# Patient Record
Sex: Male | Born: 1935 | Race: White | Hispanic: No | Marital: Married | Smoking: Current every day smoker
Health system: Southern US, Community
[De-identification: ages and names within clinical notes are randomized; demographics above are authoritative.]

## PROBLEM LIST (undated history)

## (undated) DIAGNOSIS — N200 Calculus of kidney: Secondary | ICD-10-CM

## (undated) DIAGNOSIS — E079 Disorder of thyroid, unspecified: Secondary | ICD-10-CM

## (undated) DIAGNOSIS — I10 Essential (primary) hypertension: Secondary | ICD-10-CM

## (undated) DIAGNOSIS — C189 Malignant neoplasm of colon, unspecified: Secondary | ICD-10-CM

## (undated) DIAGNOSIS — G629 Polyneuropathy, unspecified: Secondary | ICD-10-CM

## (undated) DIAGNOSIS — D649 Anemia, unspecified: Secondary | ICD-10-CM

## (undated) DIAGNOSIS — E785 Hyperlipidemia, unspecified: Secondary | ICD-10-CM

## (undated) DIAGNOSIS — F1010 Alcohol abuse, uncomplicated: Secondary | ICD-10-CM

## (undated) HISTORY — DX: Alcohol abuse, uncomplicated: F10.10

## (undated) HISTORY — DX: Malignant neoplasm of colon, unspecified: C18.9

## (undated) HISTORY — DX: Essential (primary) hypertension: I10

## (undated) HISTORY — DX: Polyneuropathy, unspecified: G62.9

## (undated) HISTORY — DX: Calculus of kidney: N20.0

## (undated) HISTORY — PX: CATARACT EXTRACTION W/ INTRAOCULAR LENS  IMPLANT, BILATERAL: SHX1307

## (undated) HISTORY — PX: COLONOSCOPY: SHX174

## (undated) HISTORY — DX: Anemia, unspecified: D64.9

## (undated) HISTORY — DX: Hyperlipidemia, unspecified: E78.5

---

## 1997-12-28 ENCOUNTER — Emergency Department (HOSPITAL_COMMUNITY): Admission: EM | Admit: 1997-12-28 | Discharge: 1997-12-28 | Payer: Self-pay | Admitting: Emergency Medicine

## 2001-06-09 ENCOUNTER — Encounter: Payer: Self-pay | Admitting: Emergency Medicine

## 2001-06-09 ENCOUNTER — Encounter (INDEPENDENT_AMBULATORY_CARE_PROVIDER_SITE_OTHER): Payer: Self-pay | Admitting: *Deleted

## 2001-06-10 ENCOUNTER — Encounter: Payer: Self-pay | Admitting: General Surgery

## 2001-06-10 ENCOUNTER — Inpatient Hospital Stay (HOSPITAL_COMMUNITY): Admission: EM | Admit: 2001-06-10 | Discharge: 2001-06-22 | Payer: Self-pay | Admitting: Emergency Medicine

## 2001-06-11 ENCOUNTER — Encounter: Payer: Self-pay | Admitting: General Surgery

## 2001-06-11 HISTORY — PX: COLOSTOMY: SHX63

## 2001-06-14 ENCOUNTER — Encounter: Payer: Self-pay | Admitting: General Surgery

## 2001-06-20 ENCOUNTER — Encounter: Payer: Self-pay | Admitting: General Surgery

## 2001-07-10 ENCOUNTER — Encounter (HOSPITAL_COMMUNITY): Payer: Self-pay | Admitting: Oncology

## 2001-07-10 ENCOUNTER — Ambulatory Visit (HOSPITAL_COMMUNITY): Admission: RE | Admit: 2001-07-10 | Discharge: 2001-07-10 | Payer: Self-pay | Admitting: Oncology

## 2002-01-10 ENCOUNTER — Encounter (HOSPITAL_COMMUNITY): Payer: Self-pay | Admitting: Oncology

## 2002-01-10 ENCOUNTER — Ambulatory Visit (HOSPITAL_COMMUNITY): Admission: RE | Admit: 2002-01-10 | Discharge: 2002-01-10 | Payer: Self-pay | Admitting: Oncology

## 2002-03-07 ENCOUNTER — Ambulatory Visit (HOSPITAL_COMMUNITY): Admission: RE | Admit: 2002-03-07 | Discharge: 2002-03-07 | Payer: Self-pay | Admitting: Gastroenterology

## 2002-03-27 ENCOUNTER — Ambulatory Visit (HOSPITAL_COMMUNITY): Admission: RE | Admit: 2002-03-27 | Discharge: 2002-03-27 | Payer: Self-pay | Admitting: General Surgery

## 2002-03-27 ENCOUNTER — Encounter: Payer: Self-pay | Admitting: General Surgery

## 2002-05-02 ENCOUNTER — Encounter (HOSPITAL_COMMUNITY): Payer: Self-pay | Admitting: Oncology

## 2002-05-02 ENCOUNTER — Ambulatory Visit (HOSPITAL_COMMUNITY): Admission: RE | Admit: 2002-05-02 | Discharge: 2002-05-02 | Payer: Self-pay | Admitting: Oncology

## 2002-05-13 ENCOUNTER — Inpatient Hospital Stay (HOSPITAL_COMMUNITY): Admission: RE | Admit: 2002-05-13 | Discharge: 2002-05-18 | Payer: Self-pay | Admitting: General Surgery

## 2002-05-13 ENCOUNTER — Encounter (INDEPENDENT_AMBULATORY_CARE_PROVIDER_SITE_OTHER): Payer: Self-pay | Admitting: Specialist

## 2002-05-13 HISTORY — PX: OTHER SURGICAL HISTORY: SHX169

## 2003-04-08 ENCOUNTER — Encounter (HOSPITAL_COMMUNITY): Payer: Self-pay | Admitting: Oncology

## 2003-04-08 ENCOUNTER — Ambulatory Visit (HOSPITAL_COMMUNITY): Admission: RE | Admit: 2003-04-08 | Discharge: 2003-04-08 | Payer: Self-pay | Admitting: Oncology

## 2004-03-04 ENCOUNTER — Ambulatory Visit (HOSPITAL_COMMUNITY): Admission: RE | Admit: 2004-03-04 | Discharge: 2004-03-04 | Payer: Self-pay | Admitting: Oncology

## 2004-08-20 ENCOUNTER — Ambulatory Visit: Payer: Self-pay | Admitting: Oncology

## 2005-02-24 ENCOUNTER — Ambulatory Visit: Payer: Self-pay | Admitting: Oncology

## 2005-02-24 ENCOUNTER — Ambulatory Visit (HOSPITAL_COMMUNITY): Admission: RE | Admit: 2005-02-24 | Discharge: 2005-02-24 | Payer: Self-pay | Admitting: Oncology

## 2005-05-23 ENCOUNTER — Ambulatory Visit: Payer: Self-pay | Admitting: Oncology

## 2005-11-17 ENCOUNTER — Ambulatory Visit: Payer: Self-pay | Admitting: Oncology

## 2005-11-22 LAB — COMPREHENSIVE METABOLIC PANEL
Albumin: 4.5 g/dL (ref 3.5–5.2)
Alkaline Phosphatase: 68 U/L (ref 39–117)
BUN: 17 mg/dL (ref 6–23)
CO2: 26 mEq/L (ref 19–32)
Calcium: 9.6 mg/dL (ref 8.4–10.5)
Chloride: 104 mEq/L (ref 96–112)
Glucose, Bld: 103 mg/dL — ABNORMAL HIGH (ref 70–99)
Potassium: 4.5 mEq/L (ref 3.5–5.3)
Sodium: 143 mEq/L (ref 135–145)
Total Protein: 7.5 g/dL (ref 6.0–8.3)

## 2005-11-22 LAB — CBC WITH DIFFERENTIAL/PLATELET
Basophils Absolute: 0 10*3/uL (ref 0.0–0.1)
EOS%: 2.9 % (ref 0.0–7.0)
Eosinophils Absolute: 0.2 10*3/uL (ref 0.0–0.5)
HCT: 41.1 % (ref 38.7–49.9)
HGB: 14.4 g/dL (ref 13.0–17.1)
MCH: 34.1 pg — ABNORMAL HIGH (ref 28.0–33.4)
MONO#: 0.8 10*3/uL (ref 0.1–0.9)
NEUT#: 3.9 10*3/uL (ref 1.5–6.5)
NEUT%: 59.3 % (ref 40.0–75.0)
RDW: 13.5 % (ref 11.2–14.6)
WBC: 6.6 10*3/uL (ref 4.0–10.0)
lymph#: 1.7 10*3/uL (ref 0.9–3.3)

## 2005-11-22 LAB — LACTATE DEHYDROGENASE: LDH: 142 U/L (ref 94–250)

## 2006-04-18 ENCOUNTER — Ambulatory Visit (HOSPITAL_COMMUNITY): Admission: RE | Admit: 2006-04-18 | Discharge: 2006-04-18 | Payer: Self-pay | Admitting: Oncology

## 2006-05-19 ENCOUNTER — Ambulatory Visit: Payer: Self-pay | Admitting: Oncology

## 2006-05-23 LAB — CBC WITH DIFFERENTIAL/PLATELET
BASO%: 0.3 % (ref 0.0–2.0)
Basophils Absolute: 0 10*3/uL (ref 0.0–0.1)
Eosinophils Absolute: 0.1 10*3/uL (ref 0.0–0.5)
HGB: 14 g/dL (ref 13.0–17.1)
LYMPH%: 20 % (ref 14.0–48.0)
MCH: 34.1 pg — ABNORMAL HIGH (ref 28.0–33.4)
MCHC: 35.4 g/dL (ref 32.0–35.9)
MONO#: 0.7 10*3/uL (ref 0.1–0.9)
lymph#: 1.4 10*3/uL (ref 0.9–3.3)

## 2006-05-23 LAB — COMPREHENSIVE METABOLIC PANEL
ALT: 14 U/L (ref 0–53)
BUN: 11 mg/dL (ref 6–23)
CO2: 25 mEq/L (ref 19–32)
Calcium: 8.6 mg/dL (ref 8.4–10.5)
Chloride: 105 mEq/L (ref 96–112)
Creatinine, Ser: 0.93 mg/dL (ref 0.40–1.50)
Total Bilirubin: 0.8 mg/dL (ref 0.3–1.2)

## 2006-05-23 LAB — CEA: CEA: 2.1 ng/mL (ref 0.0–5.0)

## 2006-05-23 LAB — LACTATE DEHYDROGENASE: LDH: 139 U/L (ref 94–250)

## 2006-05-29 ENCOUNTER — Ambulatory Visit (HOSPITAL_COMMUNITY): Admission: RE | Admit: 2006-05-29 | Discharge: 2006-05-29 | Payer: Self-pay | Admitting: Oncology

## 2006-11-28 ENCOUNTER — Ambulatory Visit: Payer: Self-pay | Admitting: Oncology

## 2006-11-30 LAB — CBC WITH DIFFERENTIAL/PLATELET
BASO%: 0.4 % (ref 0.0–2.0)
HCT: 41.4 % (ref 38.7–49.9)
MCHC: 35.8 g/dL (ref 32.0–35.9)
MONO#: 0.6 10*3/uL (ref 0.1–0.9)
NEUT%: 60.1 % (ref 40.0–75.0)
RDW: 13.5 % (ref 11.2–14.6)
WBC: 7.1 10*3/uL (ref 4.0–10.0)
lymph#: 2.1 10*3/uL (ref 0.9–3.3)

## 2006-11-30 LAB — COMPREHENSIVE METABOLIC PANEL
ALT: 15 U/L (ref 0–53)
Albumin: 4.4 g/dL (ref 3.5–5.2)
CO2: 23 mEq/L (ref 19–32)
Calcium: 9.2 mg/dL (ref 8.4–10.5)
Chloride: 108 mEq/L (ref 96–112)
Creatinine, Ser: 0.92 mg/dL (ref 0.40–1.50)
Potassium: 4.1 mEq/L (ref 3.5–5.3)
Sodium: 142 mEq/L (ref 135–145)
Total Protein: 7.5 g/dL (ref 6.0–8.3)

## 2006-11-30 LAB — LACTATE DEHYDROGENASE: LDH: 153 U/L (ref 94–250)

## 2007-05-29 ENCOUNTER — Ambulatory Visit: Payer: Self-pay | Admitting: Oncology

## 2007-05-31 ENCOUNTER — Ambulatory Visit (HOSPITAL_COMMUNITY): Admission: RE | Admit: 2007-05-31 | Discharge: 2007-05-31 | Payer: Self-pay | Admitting: Oncology

## 2007-05-31 LAB — CBC WITH DIFFERENTIAL/PLATELET
BASO%: 0.7 % (ref 0.0–2.0)
Eosinophils Absolute: 0.2 10*3/uL (ref 0.0–0.5)
MCHC: 35.6 g/dL (ref 32.0–35.9)
MONO#: 0.6 10*3/uL (ref 0.1–0.9)
NEUT#: 3.8 10*3/uL (ref 1.5–6.5)
RBC: 4.23 10*6/uL (ref 4.20–5.71)
RDW: 13.4 % (ref 11.2–14.6)
WBC: 7 10*3/uL (ref 4.0–10.0)
lymph#: 2.3 10*3/uL (ref 0.9–3.3)

## 2007-05-31 IMAGING — CT CT PELVIS W/ CM
1 of 3 series · 13 of 32 positions shown, 18 images · IV contrast (omnipaque)
Comparison: 01/10/02.

CLINICAL DATA: 69 year old with history of colon cancer; right lower quadrant abdominal pain.
 ABDOMEN CT WITH CONTRAST:
TECHNIQUE: Multidetector CT imaging of the abdomen was performed following the standard protocol during bolus administration of intravenous contrast.
 Contrast:  125 cc Omnipaque 300 IV.  Dilute oral contrast was used.
TECHNIQUE: Multidetector CT imaging of the pelvis was performed following the standard protocol during bolus administration of intravenous contrast.

[Series 2: abd_pel 5.0 b40f st · axial · 0.70mm/px · z∈[-239,+146]mm · 13 of 87 slices shown, 18 images]
[im 5/87  soft-tissue]
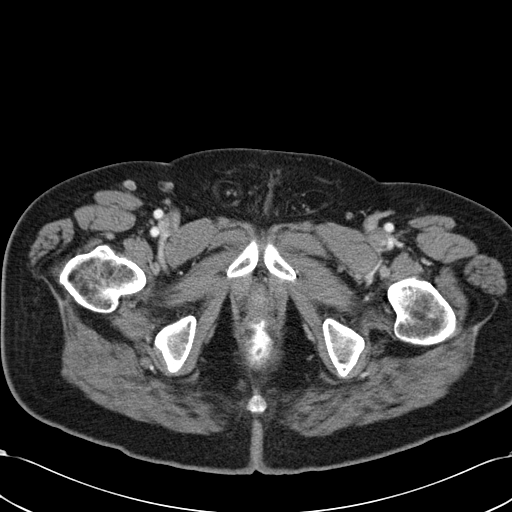
[im 5/87  bone]
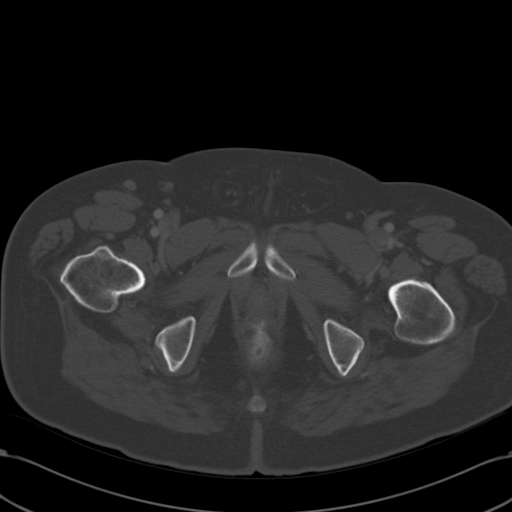
[im 14/87  soft-tissue]
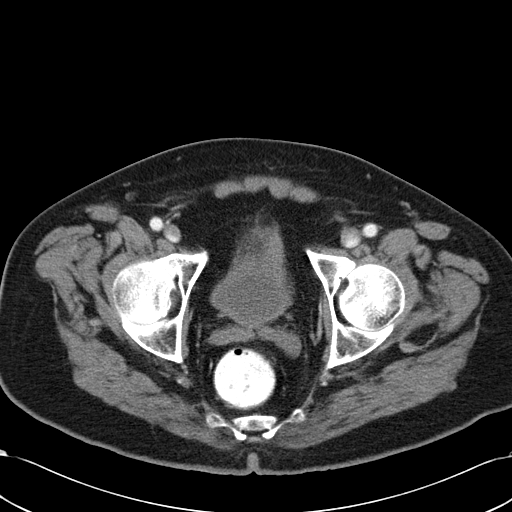
[im 19/87  soft-tissue]
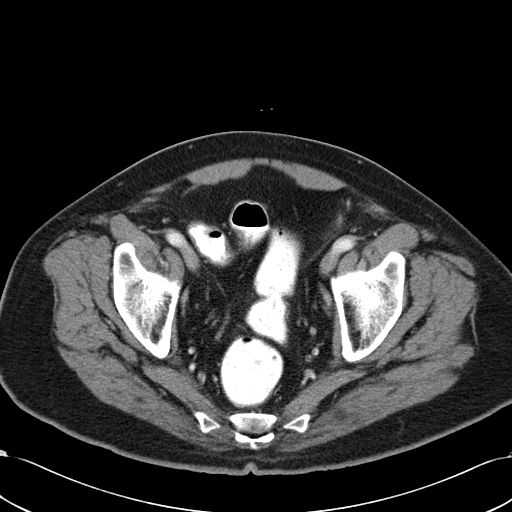
[im 28/87  soft-tissue]
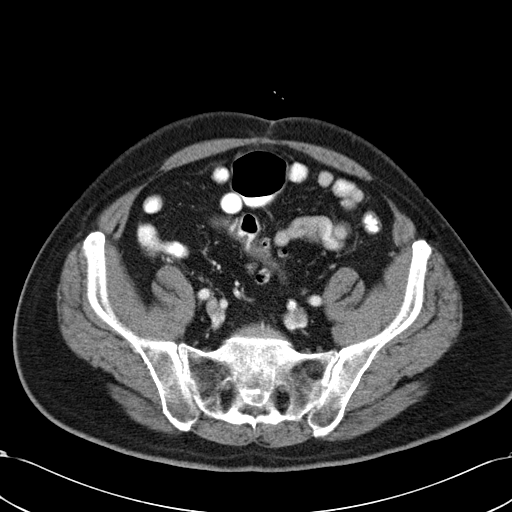
[im 32/87  soft-tissue]
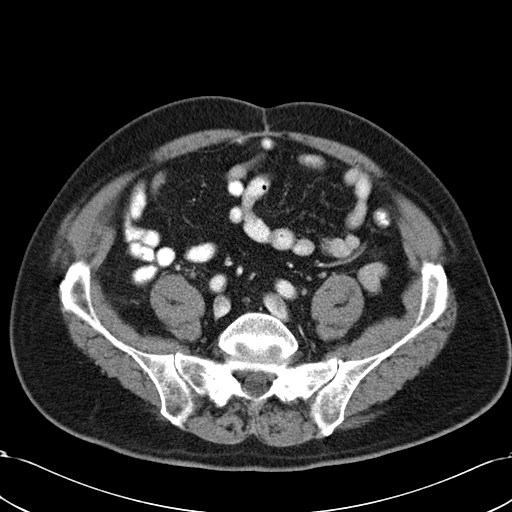
[im 41/87  soft-tissue]
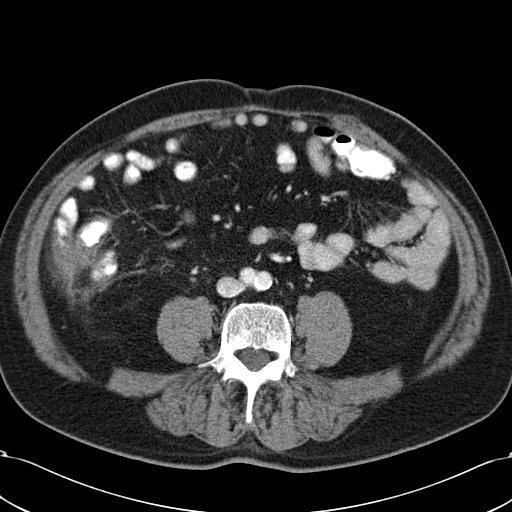
[im 46/87  soft-tissue]
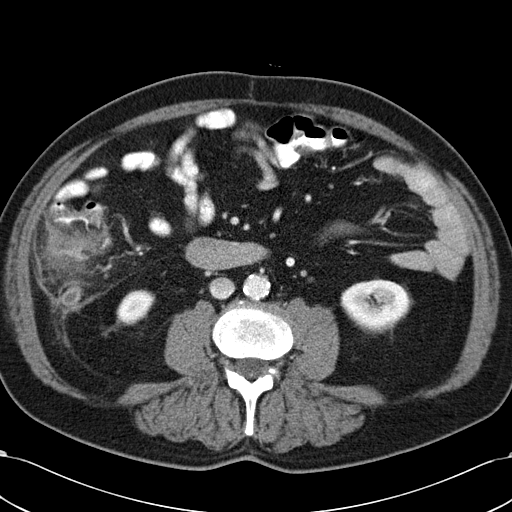
[im 55/87  soft-tissue]
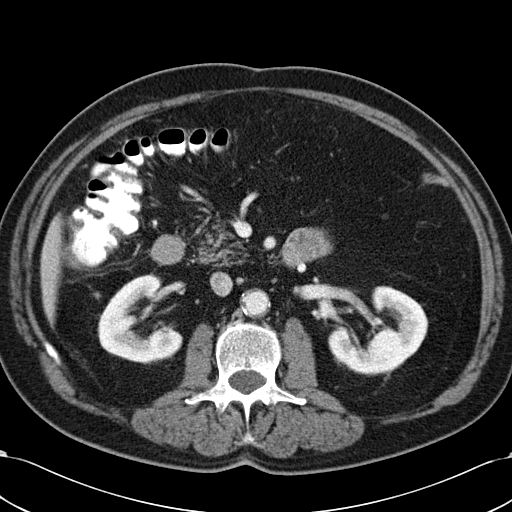
[im 59/87  soft-tissue]
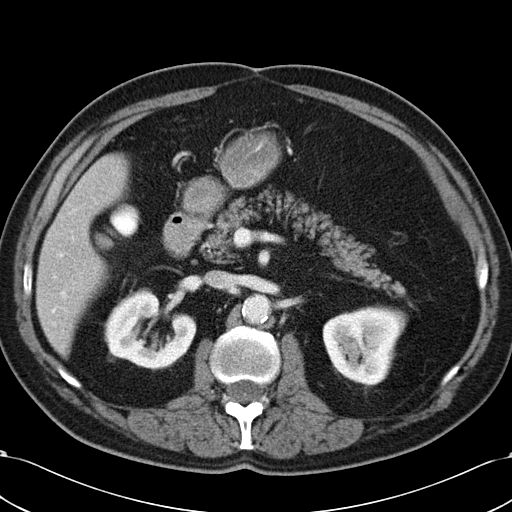
[im 59/87  bone]
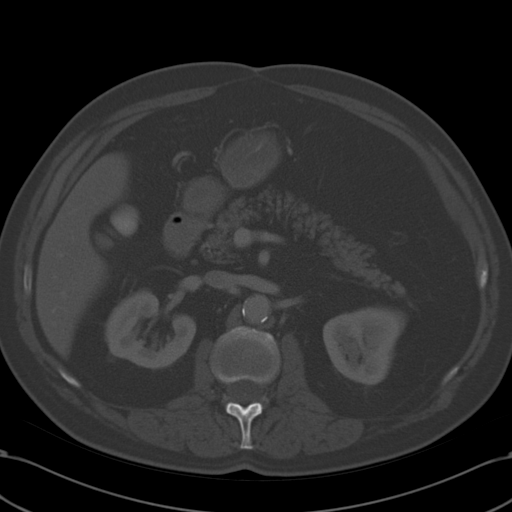
[im 68/87  soft-tissue]
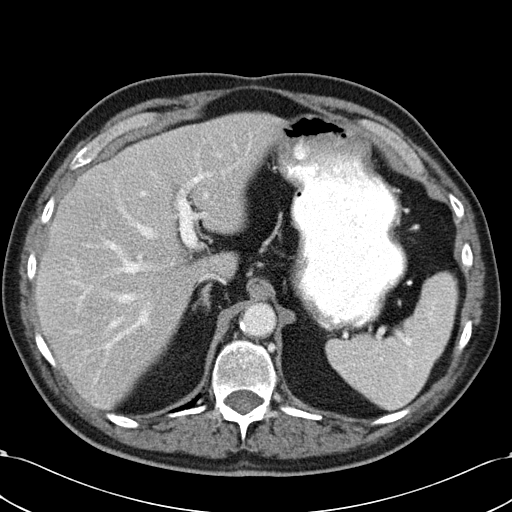
[im 68/87  lung]
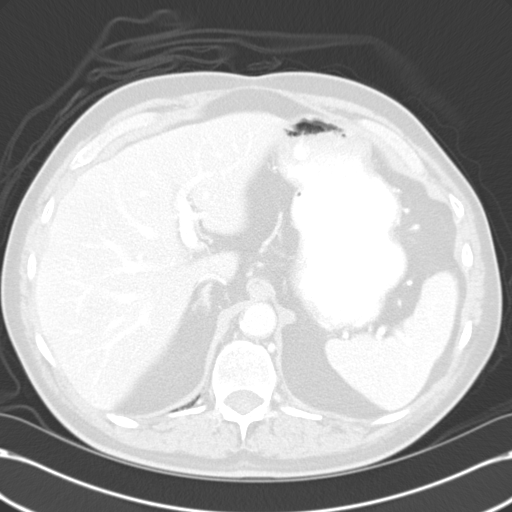
[im 73/87  soft-tissue]
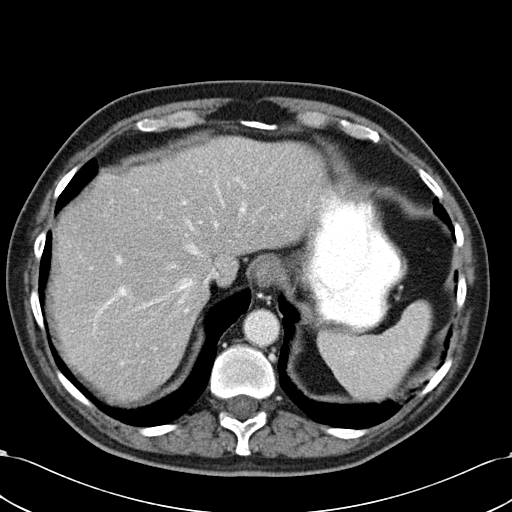
[im 73/87  lung]
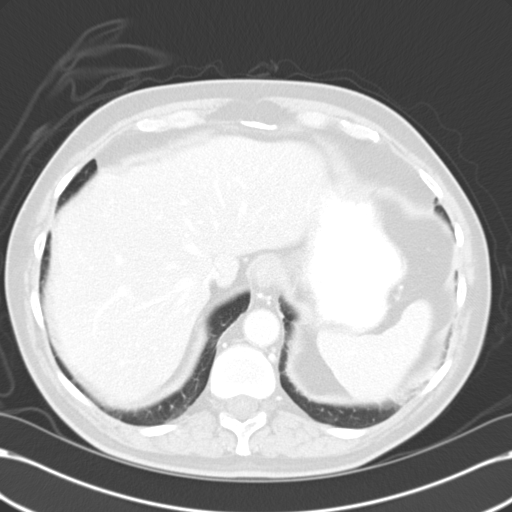
[im 77/87  lung]
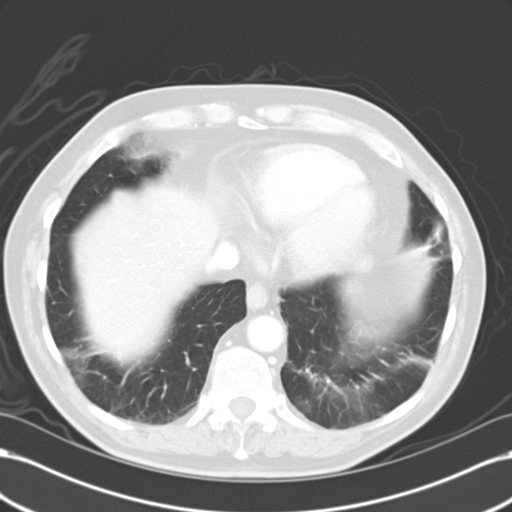
[im 82/87  soft-tissue]
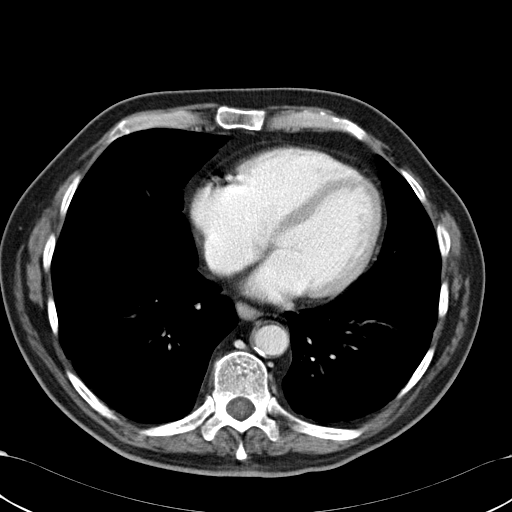
[im 82/87  lung]
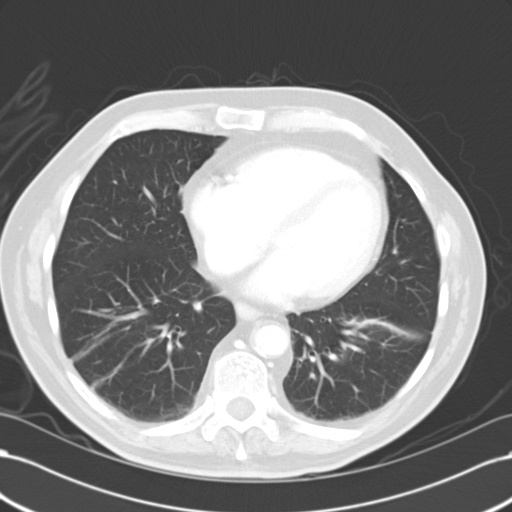

[13 of 32 positions shown; findings below may reference images not displayed]

FINDINGS: The lung bases are clear except for dependent atelectasis and lower lobe scarring changes.  No pulmonary masses or nodules.
 There is fatty infiltration of the liver.  There are no focal hepatic lesions and no intrahepatic ductal dilatation.  The gallbladder appears normal.  The spleen is normal in size.  The pancreas and adrenal glands are unremarkable.  There is a left renal calculus.  This is in the collecting system but is not causing obstruction.  It measures approximately 1 cm in size.  The aorta is normal in caliber with scattered atherosclerotic calcifications.  No mesenteric or retroperitoneal masses or adenopathy.  There are a few small gastroduodenal ligaments which are within normal limits.  No significant bony findings.
 The stomach is fairly well distended with contrast and demonstrates no gross abnormalities.  The duodenum and small bowel are unremarkable.  There is a marked inflammatory process involving the right colon.  This involves the cecum and part of the ascending colon to the level of the hepatic flexure.  There are also secondary inflammatory changes involving the appendix which is slightly dilated and fluid-filled and there is enhancement of the mucosa.  However, I think this is primarily a cecum inflammatory process but secondarily involving the appendix.  It may be partially obstructing it.  The findings could be secondary to cecal diverticulitis.  If the patient is immunocompromised typhlitis would be another possibility.  An infectious colitis would also be possible.  The terminal ileum is normal.
IMPRESSION: 1.  Marked inflammatory process involving the right colon with secondary inflammation or possible obstruction of the appendix.  No mass lesion is seen.  No perforation.
 2.  1 cm left renal calculus without obstruction.
 PELVIS CT WITH CONTRAST:
FINDINGS: The rectum, sigmoid colon, and visualized small bowel loops appear normal.   No pelvic masses or adenopathy.  The bladder appears normal.
IMPRESSION: Unremarkable CT pelvis.

## 2007-06-01 LAB — COMPREHENSIVE METABOLIC PANEL
ALT: 25 U/L (ref 0–53)
Albumin: 4.5 g/dL (ref 3.5–5.2)
CO2: 26 mEq/L (ref 19–32)
Glucose, Bld: 96 mg/dL (ref 70–99)
Potassium: 4.4 mEq/L (ref 3.5–5.3)
Sodium: 143 mEq/L (ref 135–145)
Total Bilirubin: 0.7 mg/dL (ref 0.3–1.2)
Total Protein: 7.5 g/dL (ref 6.0–8.3)

## 2007-06-01 LAB — CEA: CEA: 1.5 ng/mL (ref 0.0–5.0)

## 2007-06-01 LAB — LACTATE DEHYDROGENASE: LDH: 153 U/L (ref 94–250)

## 2007-11-29 ENCOUNTER — Ambulatory Visit: Payer: Self-pay | Admitting: Oncology

## 2007-12-03 LAB — CBC WITH DIFFERENTIAL/PLATELET
HCT: 40.1 % (ref 38.7–49.9)
HGB: 14 g/dL (ref 13.0–17.1)
MCH: 33.7 pg — ABNORMAL HIGH (ref 28.0–33.4)
MCHC: 35 g/dL (ref 32.0–35.9)
MCV: 96.4 fL (ref 81.6–98.0)
NEUT#: 3 10*3/uL (ref 1.5–6.5)
NEUT%: 52.3 % (ref 40.0–75.0)
RBC: 4.16 10*6/uL — ABNORMAL LOW (ref 4.20–5.71)
RDW: 13.1 % (ref 11.2–14.6)

## 2007-12-03 LAB — COMPREHENSIVE METABOLIC PANEL
Albumin: 4.4 g/dL (ref 3.5–5.2)
Alkaline Phosphatase: 67 U/L (ref 39–117)
CO2: 24 mEq/L (ref 19–32)
Calcium: 9.5 mg/dL (ref 8.4–10.5)
Chloride: 109 mEq/L (ref 96–112)
Creatinine, Ser: 0.89 mg/dL (ref 0.40–1.50)
Glucose, Bld: 93 mg/dL (ref 70–99)
Potassium: 4.2 mEq/L (ref 3.5–5.3)
Total Bilirubin: 0.6 mg/dL (ref 0.3–1.2)
Total Protein: 7.2 g/dL (ref 6.0–8.3)

## 2008-02-18 ENCOUNTER — Ambulatory Visit (HOSPITAL_COMMUNITY): Admission: RE | Admit: 2008-02-18 | Discharge: 2008-02-18 | Payer: Self-pay | Admitting: Urology

## 2008-05-29 ENCOUNTER — Ambulatory Visit (HOSPITAL_COMMUNITY): Admission: RE | Admit: 2008-05-29 | Discharge: 2008-05-29 | Payer: Self-pay | Admitting: Urology

## 2008-05-29 ENCOUNTER — Ambulatory Visit: Payer: Self-pay | Admitting: Oncology

## 2008-06-03 ENCOUNTER — Ambulatory Visit (HOSPITAL_COMMUNITY): Admission: RE | Admit: 2008-06-03 | Discharge: 2008-06-03 | Payer: Self-pay | Admitting: Oncology

## 2008-06-03 LAB — CBC WITH DIFFERENTIAL/PLATELET
Basophils Absolute: 0 10*3/uL (ref 0.0–0.1)
Eosinophils Absolute: 0.2 10*3/uL (ref 0.0–0.5)
HCT: 40.3 % (ref 38.7–49.9)
HGB: 14.1 g/dL (ref 13.0–17.1)
LYMPH%: 26.7 % (ref 14.0–48.0)
MCH: 33.8 pg — ABNORMAL HIGH (ref 28.0–33.4)
NEUT%: 61.1 % (ref 40.0–75.0)
Platelets: 203 10*3/uL (ref 145–400)
RBC: 4.16 10*6/uL — ABNORMAL LOW (ref 4.20–5.71)
RDW: 13.9 % (ref 11.2–14.6)

## 2008-06-04 LAB — COMPREHENSIVE METABOLIC PANEL
ALT: 13 U/L (ref 0–53)
AST: 16 U/L (ref 0–37)
Alkaline Phosphatase: 65 U/L (ref 39–117)
CO2: 25 mEq/L (ref 19–32)
Calcium: 9.3 mg/dL (ref 8.4–10.5)
Creatinine, Ser: 0.98 mg/dL (ref 0.40–1.50)
Glucose, Bld: 98 mg/dL (ref 70–99)

## 2009-05-29 ENCOUNTER — Ambulatory Visit: Payer: Self-pay | Admitting: Oncology

## 2009-06-02 LAB — CEA: CEA: 1.8 ng/mL (ref 0.0–5.0)

## 2009-06-02 LAB — CBC WITH DIFFERENTIAL/PLATELET
BASO%: 0.3 % (ref 0.0–2.0)
HCT: 44.8 % (ref 38.4–49.9)
MCV: 99.8 fL — ABNORMAL HIGH (ref 79.3–98.0)
MONO#: 0.6 10*3/uL (ref 0.1–0.9)
MONO%: 8.2 % (ref 0.0–14.0)
NEUT#: 4.9 10*3/uL (ref 1.5–6.5)
NEUT%: 67.3 % (ref 39.0–75.0)
RBC: 4.49 10*6/uL (ref 4.20–5.82)
RDW: 13.2 % (ref 11.0–14.6)
WBC: 7.3 10*3/uL (ref 4.0–10.3)

## 2009-06-02 LAB — COMPREHENSIVE METABOLIC PANEL
ALT: 29 U/L (ref 0–53)
AST: 26 U/L (ref 0–37)
BUN: 14 mg/dL (ref 6–23)
Potassium: 4.4 mEq/L (ref 3.5–5.3)
Sodium: 139 mEq/L (ref 135–145)
Total Bilirubin: 0.6 mg/dL (ref 0.3–1.2)
Total Protein: 7.7 g/dL (ref 6.0–8.3)

## 2009-06-02 LAB — LACTATE DEHYDROGENASE: LDH: 169 U/L (ref 94–250)

## 2010-04-20 ENCOUNTER — Encounter: Admission: RE | Admit: 2010-04-20 | Discharge: 2010-04-20 | Payer: Self-pay | Admitting: Internal Medicine

## 2010-05-01 ENCOUNTER — Encounter: Admission: RE | Admit: 2010-05-01 | Discharge: 2010-05-01 | Payer: Self-pay | Admitting: Internal Medicine

## 2010-06-01 ENCOUNTER — Ambulatory Visit: Payer: Self-pay | Admitting: Oncology

## 2010-06-03 ENCOUNTER — Ambulatory Visit (HOSPITAL_COMMUNITY)
Admission: RE | Admit: 2010-06-03 | Discharge: 2010-06-03 | Payer: Self-pay | Source: Home / Self Care | Admitting: Oncology

## 2010-06-03 LAB — COMPREHENSIVE METABOLIC PANEL
ALT: 22 U/L (ref 0–53)
Albumin: 3.8 g/dL (ref 3.5–5.2)
CO2: 28 mEq/L (ref 19–32)
Chloride: 106 mEq/L (ref 96–112)
Glucose, Bld: 96 mg/dL (ref 70–99)
Potassium: 4 mEq/L (ref 3.5–5.3)
Total Protein: 7.2 g/dL (ref 6.0–8.3)

## 2010-06-03 LAB — CBC WITH DIFFERENTIAL/PLATELET
BASO%: 0.3 % (ref 0.0–2.0)
LYMPH%: 23.9 % (ref 14.0–49.0)
MCH: 34 pg — ABNORMAL HIGH (ref 27.2–33.4)
NEUT%: 65.4 % (ref 39.0–75.0)
RBC: 4.03 10*6/uL — ABNORMAL LOW (ref 4.20–5.82)
RDW: 13.8 % (ref 11.0–14.6)
lymph#: 1.3 10*3/uL (ref 0.9–3.3)

## 2010-06-03 LAB — LACTATE DEHYDROGENASE: LDH: 134 U/L (ref 94–250)

## 2010-06-04 LAB — CEA: CEA: 2.2 ng/mL (ref 0.0–5.0)

## 2010-11-12 NOTE — Discharge Summary (Signed)
   NAME:  [name], [name]                          ACCOUNT NO.:  [id number]   MEDICAL RECORD NO.:  [id number]                   PATIENT TYPE:  INP   LOCATION:  0462                                 FACILITY:  [facility]   PHYSICIAN:  Adolph Pollack, M.D.            DATE OF BIRTH:  June 09, 1935   DATE OF ADMISSION:  05/13/2002  DATE OF DISCHARGE:  05/18/2002                                 DISCHARGE SUMMARY   PRINCIPAL DISCHARGE DIAGNOSIS:  Colostomy.   SECONDARY DIAGNOSES:  1. Colon cancer.  2. Gout.   PROCEDURE:  Laparoscopic assisted colostomy closure.   REASON FOR ADMISSION:  The patient is a 76 year old male who presented in  12/02, with a bowel obstruction secondary to an expected sigmoid colon  cancer.  A partial colectomy and colostomy were performed at that time.  He  underwent chemotherapy postoperatively.  He has undergo studies for  metastatic disease which have been negative.  He is admitted for colostomy  closure.   HOSPITAL COURSE:  He underwent the above operation and actually did quite  well.  He had acute left gouty arthritis, and he was started back on his  Naproxen and this caused this to improve.  He began passing some flatus on  the third postoperative day, and a diet was started and advanced.  By his  fifth postoperative day, he was doing well, the wound looked good, and he  was felt to be ready for discharge.   CONDITION ON DISCHARGE:  Discharged to home on 05/18/02, in satisfactory  condition.   DISCHARGE MEDICATIONS:  1. Tylox for pain.  2. Told to take his home medications.   ACTIVITY:  Restricted.   FOLLOWUP:  He will see me back in two weeks for followup.                                               Adolph Pollack, M.D.    Kari Baars  D:  05/28/2002  T:  05/28/2002  Job:  [id number]   cc:   Samul Dada, M.D.  [address] [facility]  [place]  [place] [address]  Fax: [phone]

## 2010-11-12 NOTE — Op Note (Signed)
   [name], [name]                         ACCOUNT NO.:  [id number]   MEDICAL RECORD NO.:  [id number]                   PATIENT TYPE:  AMB   LOCATION:  ENDO                                 FACILITY:  [facility]   PHYSICIAN:  Petra Kuba, M.D.                 DATE OF BIRTH:  [birth year]   DATE OF PROCEDURE:  DATE OF DISCHARGE:                                 OPERATIVE REPORT   PROCEDURE:  Colonoscopy via the ostomy.   INDICATIONS FOR PROCEDURE:  A patient with a history of colon cancer status  post surgery. Dr. Abbey Chatters considering repeat surgery to remove the ostomy  and reanastomose. Consent was signed after risks, benefits, methods, and  options were thoroughly discussed in the office.   MEDICINES USED:  Demerol 60, Versed 6.   DESCRIPTION OF PROCEDURE:  The ostomy appeared normal. Digital exam of the  ostomy was normal. The pediatric video colonoscope was inserted through the  ostomy and easily advanced to the cecum which was at 30 cm. It was much  quicker than we had thought. The cecum was identified by the appendiceal  orifice and the ileocecal valve. No obvious abnormality was seen on  insertion. The scope was slowly withdrawn. There were no abnormalities seen  on slow withdrawal. Near the ostomy, he did have some trouble holding air so  complete visualization of that area was not obtained, but no other  abnormalities were seen as we slowly withdrew. Once air was removed, the  patient was rolled on his side and a flex sig was done to evaluate the  Hartmann's pouch, please see that dictation for further workup and plans.   ASSESSMENT:  Normal colonoscopy via the ostomy and exam to the cecum in  about 30 cm without abnormality seen. Last few centimeters not well  evaluated due to difficulty holding air.   PLAN:  Surgery per Dr.  Abbey Chatters. Happy to see back p.r.n., otherwise,  repeat screening in three years unless needed sooner p.r.n.  Please see flex  sig dictation  for other findings.                                               Petra Kuba, M.D.    MEM/MEDQ  D:  03/07/2002  T:  03/08/2002  Job:  [id number]   cc:   Adolph Pollack, M.D.  Fax: [phone]   Samul Dada, M.D.  [address] [facility]  [place]  [place] [address]  Fax: [phone]

## 2010-11-12 NOTE — H&P (Signed)
[facility]  Patient:    [name], [name]. Visit Number: [id number] MRN: [id number]          Service Type: Attending:  Adolph Pollack, M.D. Dictated by:   Adolph Pollack, M.D. Adm. Date:  06/10/01                           History and Physical  CHIEF COMPLAINT:  Abdominal distention, nausea, and vomiting.  HISTORY OF PRESENT ILLNESS:  The patient is Hawkins 76 year old male who has no primary care physician, and has not seen Hawkins physician in many, many years. Over the past seven to eight weeks, he has had progressively increasing nausea and vomiting and abdominal distention until now, and he has had persistent vomiting that is feculent in nature by his description.  No bowel movement for seven to eight days.  Denies blood in his stool.  Before this, he denies any change in his stool or stool caliber.  He has had Hawkins 15 pound weight loss and feels weaker.  There is no family history of colon cancer.  He denies any fevers or chills.  He presented to the emergency department tonight for evaluation because of his persistent vomiting, weight loss, and abdominal distention.  He has been intermittently able to eat egg sandwiches, drink Gatorade and water.  PAST MEDICAL HISTORY:  No chronic illnesses.  PAST SURGICAL HISTORY:  None.  ALLERGIES:  No known drug allergies.  MEDICATIONS:  None.  SOCIAL HISTORY:  He used to smoke cigarettes and now smokes Hawkins pipe.  He would drink anywhere from 60-80 ounces of alcohol Hawkins day per his wife.  He has had three DWIs.  He is retired from [employer].  FAMILY HISTORY:  Positive for _________ in his mother and diverticulosis/diverticulitis in his father.  REVIEW OF SYSTEMS:  CARDIOVASCULAR:  He denies hypertension, heart disease, or angina.  PULMONARY:  Denies asthma and COPD.  GI:  No peptic ulcer disease or known liver disease.  GU:  He has had Hawkins kidney stone in the past.  ENDOCRINE: Denies diabetes or  thyroid disease.  HEMATOLOGIC:  Denied deep venous thrombosis, transfusions, or bleeding disorders.  PHYSICAL EXAMINATION:  GENERAL:  Hawkins thin-appearing male in no acute distress, pleasant, and cooperative.  VITAL SIGNS:  Temperature is 98.6, blood pressure 121/96, pulse 109, respirations 20.  SKIN:  Warm and dry without rash or jaundice.  HEENT:  Normocephalic and atraumatic.  Extraocular motions intact.  No scleral icterus.  NECK:  Supple, no palpable masses.  CARDIOVASCULAR:  Heart demonstrates Hawkins regular rate and rhythm without Hawkins murmur.  No carotid bruits.  RESPIRATORY:  Breath sounds are equal and clear.  Respirations unlabored.  ABDOMEN:  Slightly firm, distended, but nontender.  It is tympanitic. Intermittent bowel sounds are noted.  No palpable masses noted.  No umbilical hernia.  GU:  Both testicles are descended.  No inguinal hernias are noted.  No penile lesions.  RECTAL EXAMINATION:  Normal sphincter tone.  No external lesions.  Prostate is smooth.  No palpable masses and Hawkins small smear of stool is noted that is guaiac negative.  EXTREMITIES:  Cachectic with muscular atrophy, but no edema present.  LABORATORY DATA:  Remarkable for sodium 131, potassium 3.4, albumin 3.2.  SGOT 77, SGPT 146, alkaline phosphatase normal at 61, total bilirubin 1.6.  White blood cell count is 5600 with hemoglobin of 14.8, and normal platelet count. Lipase is normal.  Three-way  chest x-ray that has no acute disease noted.  On his abdominal x-rays are dilated loops of small and large bowel with Hawkins cut sign around the splenic flexure just distal to it.  IMPRESSION:  Intestinal obstruction, most likely large bowel obstruction.  I think the most likely causes of neoplastic process whether be inflammatory from diverticular disease or Hawkins malignant neoplasm.  His elevated liver function tests could be from his chronic alcohol abuse versus involvement by his liver disease.  PLAN:  The  patient with IV fluid hydration.  Will perform barium enema later today.  With the distal large bowel obstruction, I would recommend he undergo segmental colectomy and colostomy.  I did explain the procedure, rationale, and risks of the segmental colectomy with colostomy.  We talked about the potential bleeding, infection, anesthetic effect in cardiopulmonary areas, and also potential for intra-abdominal organ damage to the intestines, ureters, kidney, bladder, etc.  He seems to understand all this and is agreeable to proceeding as planned. Dictated by:   Adolph Pollack, M.D. Attending:  Adolph Pollack, M.D. DD:  06/10/01 TD:  06/10/01 Job: [id number]

## 2010-11-12 NOTE — Discharge Summary (Signed)
[place]  Patient:    [name], [name] Visit Number: [id number] MRN: [id number]          Service Type: SUR Location: [phone] Attending Physician:  Arlis Porta Dictated by:   Adolph Pollack, M.D. Admit Date:  06/09/2001 Discharge Date: 06/22/2001                             Discharge Summary  PRINCIPAL DISCHARGE DIAGNOSIS:  Obstructing sigmoid colon cancer.  SECONDARY DIAGNOSES: 1. Malnutrition. 2. Acute blood loss anemia. 3. Postoperative alcohol withdrawal. 4. Postoperative pneumonia. 5. Hypophosphatemia.  PROCEDURES:  Partial left colectomy with colostomy and central venous catheter insertion.  REASON FOR ADMISSION:  [name] is a 76 year old male who presented with a seven to eight-week history of nausea and vomiting and a 15 pound weight loss. He had been obstipated for seven to eight days.  He was vomiting feculent material.  He had some clinical signs and x-ray signs of obstruction and was admitted to the hospital.  HOSPITAL COURSE:  The patient underwent a Gastrografin enema which demonstrated a completely obstruction lesion in the proximal descending colon. He subsequently was taken to the operating room where he underwent the partial colectomy (left side) with colostomy and central venous catheter insertion. Postoperatively, he was noted to have some acute blood loss anemia and early stool contamination of his wound as his stoma did not fit very well.  He had a fentanyl epidural for pain relief.  He began to get more sedated as time went on so that by the third postoperative day, he was having difficulty breathing. He was given some Narcan and transferred to the intensive care unit and he improved once his narcotics had been restricted.  He appeared that he developed a pneumonia and he was started on antibiotics as well.  He was started on total parenteral nutrition.  His colostomy began to work and was  healthy-appearing.  He had what appeared to be some alcohol withdrawal and we treated him for this as well and he improved dramatically by approximately his seventh postoperative day.  His IV antibiotics continued for his pneumonia.  He was fairly weak so physical therapy was asked to work with him with respect to strengthening and he was transferred to the floor on Christmas day.  He subsequently had excellent improvement.  He was taught some colostomy care.  He was able to be discharged on June 22, 2001.  DISPOSITION:  Discharged to home June 22, 2001.  MEDICATIONS:  He was given Vicodin for pain and told to take Tequin for his pneumonia.  DISCHARGE INSTRUCTIONS:  His activity restrictions were given.  He was given the name of an ____________ nurse to call for assistance with ostomy care and will come back and see me in one week. Dictated by:   Adolph Pollack, M.D. Attending Physician:  Arlis Porta DD:  07/13/01 TD:  07/16/01 Job: [id number]

## 2010-11-12 NOTE — Op Note (Signed)
[place]  Patient:    [name], [name] Visit Number: [id number] MRN: [id number]          Service Type: SUR Location: [phone] Attending Physician:  Arlis Porta Dictated by:   Adolph Pollack, M.D. Proc. Date: 06/11/01 Admit Date:  06/09/2001                             Operative Report  PREOPERATIVE DIAGNOSIS:  Colonic obstruction and malnutrition.  POSTOPERATIVE DIAGNOSIS:  Colonic obstruction secondary to neoplastic lesion and malnutrition.  OPERATION PERFORMED:  Partial colectomy (left), colostomy, central venous catheter insertion.  SURGEON:  Adolph Pollack, M.D.  ASSISTANT:  Chevis Pretty, M.D.  ANESTHESIA:  General.  ESTIMATED BLOOD LOSS:  800 cc.  INDICATIONS FOR PROCEDURE:  The patient is a 76 year old male admitted to the hospital with complete bowel obstruction.  Gastrografin enema demonstrated an obstructing lesion in the proximal descending colon.  He is also malnourished and has not eaten much in the past two weeks.  He now presents for elective procedure.  The procedure and risks including but not limited to bleeding, infection, accidental injury to intra-abdominal organs, and risks of anesthesia were explained to him.  We also discussed postoperative epidural catheter for pain management and I discussed this with him including the potential risks of spinal cord injury.  He would like to try this if at all possible.  FINDINGS:  There was an obstructing lesion in the proximal third of the descending colon.  There was mesentery drawn up into it.  DESCRIPTION OF PROCEDURE:  He was placed supine on the operating table and general anesthetic was administered.  The abdomen was shaved and sterilely prepped and draped a Foley catheter was placed in the bladder.   A midline incision was made incising the skin sharply.  The subcutaneous tissues was divided and the fascia divided and the peritoneal cavity  entered under direct vision.  Once in, the peritoneal cavity was explored.  The liver was smooth without lesion.  The gallbladder demonstrated no stones.  The small intestine and large intestine up to the distal large intestine were dilated.  I began by mobilizing the large intestine free from its omental attachments.  While doing this and retracting some there was a small splenic capsular tear.  Initially, I packed this off.  I then completely mobilized the descending colon by incising its lateral attachments and I was then able to mobilize the splenic flexure with cautery and then separate the omentum from the colon and bring the transverse colon and left colon up into the medial aspect of the wound.  I divided the descending colon approximately 10 cm distal to the lesion.  I then divided the transverse colon at its midportion.  I divided the mesentery between clamps and ligated the vessels.  I noted there was a fair amount of blood welling up and upon investigating it there was some persistent oozing from the splenic bed.  I evacuated the blood, applied the Surgicel and repacked it.  I then sent the specimen off to pathology for analysis.  I marked the descending colon stump with two Prolene sutures and sewed it to the posterior abdominal wall in the left midabdominal region.  I then reinspected the splenic area and there was still some oozing from it.  I went ahead and reapplied Surgicel and applied direct pressure and after approximately 10 minutes the bleeding  had stopped.  I removed all the laparotomy pads.  I then irrigated the abdominal cavity and evacuated the fluid.  The rest of the colon was examined and it was very distended.  No obvious lesions were felt. No pelvic lesions were noted.  No small intestinal lesions were noted.  The pancreas was smooth.  Once all the fluid had been evacuated I made a circular incision in the left midabdomen lateral to the umbilicus and brought  up the transverse colon stump through that.  I anchored it to the anterior and posterior fascia with interrupted 2-0 Vicryl sutures.  I then closed the abdominal fascia with running #1 PDS suture.  I irrigated the subcutaneous tissues and closed the skin with staples.  Then I matured the colostomy by incising the staple line and sealed it off releasing some of the gas and stool.  I matured it with interrupted 3-0 Vicryl sutures and placed a stoma appliance atop it.  A sterile dressing was applied.  Next, I approached the right neck, sterilely prepped and draped it.  I cannulated the internal jugular vein  with a small needle, then with a larger needle, passed a guide wire through the larger needle.  An incision was made along the wire, the tract dilated, a 20 cm triple lumen catheter was then placed over the wire and then the wire was removed.  The catheter was anchored to the skin with interrupted suture.  A sterile dressing was applied.  All the ports were aspirated and flushed.  The patient tolerated the procedure fairly well.  He did have the one small splenic capsular tear.  Estimated blood loss for this procedure was approximately 800 cc. He was hemodynamically stable throughout the case.  He subsequently was extubated and taken to the recovery room in satisfactory condition after the placement of the epidural catheter by Dr. Claybon Jabs. A chest x-ray is pending. Dictated by:   Adolph Pollack, M.D. Attending Physician:  Arlis Porta DD:  06/11/01 TD:  06/11/01 Job: [id number]

## 2010-11-12 NOTE — Op Note (Signed)
   [name], GUARDIA                         ACCOUNT NO.:  [id number]   MEDICAL RECORD NO.:  [id number]                   PATIENT TYPE:  AMB   LOCATION:  ENDO                                 FACILITY:  [facility]   PHYSICIAN:  Petra Kuba, M.D.                 DATE OF BIRTH:  [birth year]   DATE OF PROCEDURE:  DATE OF DISCHARGE:                                 OPERATIVE REPORT   PROCEDURE:  Flexible sigmoidoscopy.   INDICATIONS FOR PROCEDURE:  A patient with Hartmann's pouch want to evaluate  the distal rectum and sigmoid. Consent was signed prior to any premeds  given. Risks, benefits, methods, and options were thoroughly discussed in  the office.   Medicines total throughout this and the ostomy procedure were Demerol 60,  Versed 6.   DESCRIPTION OF PROCEDURE:  Rectal inspection was pertinent for external  hemorrhoids, digital exam was negative. The pediatric video colonoscope was  inserted and easily advanced to 20 cm. Unfortunately a formed stool log  obstructing the colon was seen, we could not wash it away nor could we  advance around it. We elected to withdraw. The distal 20 cm of rectum and  distal sigmoid were normal. The scope was retroflexed revealing some  internal hemorrhoids. The scope was straightened, air and water were  suctioned, scope removed. The patient tolerated the procedure well and there  was no obvious or immediate complications.   ENDOSCOPIC DIAGNOSIS:  1. Internal and external hemorrhoids.  2. Otherwise normal flexible sigmoidoscopy to 20 cm. Unable to advance past     formed stool, but washed and suctioned it away.   PLAN:  Surgical options per Dr. Abbey Chatters. Would recommend a few Fleets  enemas or tap water enemas prior to his anastomosis. Be happy to see back  p.r.n., otherwise, repeat screening in three years or sooner as above.                                               Petra Kuba, M.D.    MEM/MEDQ  D:  03/07/2002  T:  03/08/2002   Job:  [id number]   cc:   Adolph Pollack, M.D.  Fax: [phone]   Samul Dada, M.D.  [address] [facility]  [place]  [place] [address]  Fax: [phone]

## 2010-11-12 NOTE — Op Note (Signed)
NAME:  [name], [name]                          ACCOUNT NO.:  [id number]   MEDICAL RECORD NO.:  [id number]                   PATIENT TYPE:  INP   LOCATION:  0009                                 FACILITY:  [facility]   PHYSICIAN:  Adolph Pollack, M.D.            DATE OF BIRTH:  1935-02-27   DATE OF PROCEDURE:  05/13/2002  DATE OF DISCHARGE:                                 OPERATIVE REPORT   PREOPERATIVE DIAGNOSIS:  Descending colostomy.   POSTOPERATIVE DIAGNOSIS:  Descending colostomy.   PROCEDURE:  Laparoscopic-assisted colostomy closure.   SURGEON:  Adolph Pollack, M.D.   ASSISTANT:  Gita Kudo, M.D.   ANESTHESIA:  General.   INDICATIONS:  The patient is a 76 year old male who underwent an urgent  partial colectomy and colostomy 06/01/01 for obstructing descending colon  cancer.  He has been treated with chemotherapy.  By radiographic studies and  exam, he is now disease-free.  He now presents for colostomy closure.  He  has had a flexible sigmoidoscopy and a colonoscopy through his colostomy.  He has also had a barium enema, which demonstrates some mild diverticular  disease.  The procedure and the risks were explained to both him and his  wife in the office preoperatively.   DESCRIPTION OF PROCEDURE:  He is brought to the operating room, placed  supine on the operating table, and a general anesthetic was administered.  He was then placed in the lithotomy position.  A Foley catheter was placed  in his bladder.  The colostomy area was cleansed with Betadine and a  Betadine sponge placed over it.  His abdomen was then sterilely prepped and  draped.  In the right midabdomen a small incision was made, incising the  skin and subcutaneous tissue.  Sequential layers of fascia were incised and  the peritoneal cavity was entered.  A pursestring suture of 0 Vicryl was  placed around the edges and a Hasson trocar was introduced into the  peritoneal cavity.  A pneumoperitoneum  was created by insufflation of CO2  gas.   The laparoscope was introduced, and there were some fairly significant  adhesions between the small bowel and the midline incision, very adherent.  However, in the pelvis area in the left lower quadrant there were relatively  few adhesions.  Under direct vision in the suprapubic region, I placed a  10/11 mm trocar there.  The laparoscope was then placed through this trocar.  I could identify where I had sewn the sigmoid colon stump up to the  abdominal wall with Prolene suture, the fairly dense small bowel adhesions  and the omental adhesions, which I took down carefully bluntly and sharply,  thus exposing the colostomy.  I then did some careful blunt and sharp  dissection to mobilize the sigmoid colon stump free from the abdominal wall  and also from some of its pelvic attachments.   Next I went ahead  and made an elliptical incision around the colostomy and  mobilized it up into the wound.  This allowed me to get about 15 cm of  transverse colon up into the wound.  I was then able to grasp the sigmoid  colon stump and bring it up approximately 6 cm into the wound.  I excised  the ends of both and sent them off separately.  I then performed a hand-sewn  single-layer anastomosis with interrupted 3-0 silk sutures.  The anastomosis  was patent, viable, and under no tension.  I went ahead and placed the  anastomosis under water, insufflated air, and did not notice any leakage as  I insufflated air from the rectum.   Next gloves and gowns were changed.  I then irrigated the abdominal cavity  with 1.5 L of saline and inspected the anastomosis and irrigated and did not  notice any bleeding.  I evacuated as much of the saline as possible.  I then  closed the fascia in a single layer with running #1 PDS suture.  I  reinsufflated the abdomen and examined the closure and the anastomosis  again.  The closure was tight.  I then released the  pneumoperitoneum,  removed the trocars, and closed the right midabdomen fascial defect by  tightening up and tying down the pursestring suture.  I irrigated the wounds  with antibiotic solution.  I closed the trocar wounds with staples and only  partially closed the skin of the colostomy site with staples.  A bulky  dressing was applied.   He tolerated the procedure well without any apparent complications.  He  subsequently was taken to the recovery room in satisfactory condition.  All  needle, sponge, and instrument counts were reported to be correct x2.                                               Adolph Pollack, M.D.    Kari Baars  D:  05/13/2002  T:  05/13/2002  Job:  [id number]   cc:   Samul Dada, M.D.  [address] [facility]  [place]  [place] [address]  Fax: [phone]   Petra Kuba, M.D.  [address]  [place]  [place] [address]  Fax: [phone]

## 2010-11-12 NOTE — Procedures (Signed)
[place]  Patient:    PEREZ, DIRICO Visit Number: [id number] MRN: [id number]          Service Type: SUR Location: [phone] Attending Physician:  Arlis Porta Dictated by:   Shela Commons. Claybon Jabs, M.D. Proc. Date: 06/11/01 Admit Date:  06/09/2001                             Procedure Report  DIAGNOSIS:  Small bowel obstruction.  PROCEDURE:  Placement of epidural catheter.  HISTORY OF PRESENT ILLNESS:  Mr. Fabiano Ginley is a 76 year old white male who visits the operating room for a laparotomy and for obstructive bowel.  The patient discussed his postoperative options with Dr. Abbey Chatters, who obtained informed consent for the procedure.  The procedure was also discussed with Dr. Gypsy Balsam and the patient decided to have epidural catheter placement. Dr. Gypsy Balsam was unavailable and I was called to insert the epidural catheter.  DESCRIPTION OF PROCEDURE:  The patient was placed in the right lateral decubitus position following his surgery.  Lumbar spine was jelly prepped and draped.  The patient remained under general anesthesia.  The lumbar spine was carefully prepped and draped in a sterile fashion.  At the L2-L3 interspace, the epidural needle which was a 17 gauge Tuohy, was advanced via loss of resistance technique into the patients epidural space.  The needle did not aspirate blood or CSF and an epidural catheter was inserted, advancing approximately 4 cm into the epidural space.  The needle was then removed.  The catheter did not aspirate blood or CSF and a 5 cc test dose of 1% lidocaine was negative.  Following a negative test dose, 3 cc of fentanyl were administered to the patient, and the catheter was then secured to the patients back with tape.  The patient was placed in the supine position, extubated, and brought to the PACU.  The patient was then placed on a Marcaine and fentanyl infusion while in the PACU.  The patient tolerated  the procedure well. Dictated by:   Shela Commons. Claybon Jabs, M.D. Attending Physician:  Arlis Porta DD:  06/11/01 TD:  06/11/01 Job: [id number]

## 2011-03-31 LAB — CBC
HCT: 39 % (ref 39.0–52.0)
MCV: 98.9 fL (ref 78.0–100.0)
Platelets: 190 10*3/uL (ref 150–400)
RDW: 14.1 % (ref 11.5–15.5)
WBC: 6.4 10*3/uL (ref 4.0–10.5)

## 2011-03-31 LAB — BASIC METABOLIC PANEL
BUN: 9 mg/dL (ref 6–23)
Chloride: 108 mEq/L (ref 96–112)
Creatinine, Ser: 0.82 mg/dL (ref 0.4–1.5)
GFR calc non Af Amer: >60 mL/min (ref >60)
Glucose, Bld: 103 mg/dL — ABNORMAL HIGH (ref 70–99)
Potassium: 4.7 mEq/L (ref 3.5–5.1)

## 2011-05-29 DIAGNOSIS — N200 Calculus of kidney: Secondary | ICD-10-CM | POA: Insufficient documentation

## 2011-06-03 ENCOUNTER — Other Ambulatory Visit (HOSPITAL_COMMUNITY): Payer: Self-pay | Admitting: Oncology

## 2011-06-03 ENCOUNTER — Ambulatory Visit (HOSPITAL_BASED_OUTPATIENT_CLINIC_OR_DEPARTMENT_OTHER): Payer: Medicare Other | Admitting: Oncology

## 2011-06-03 ENCOUNTER — Other Ambulatory Visit (HOSPITAL_BASED_OUTPATIENT_CLINIC_OR_DEPARTMENT_OTHER): Payer: Medicare Other

## 2011-06-03 VITALS — BP 155/86 | HR 74 | Temp 97.4°F | Ht 70.5 in | Wt 175.1 lb

## 2011-06-03 DIAGNOSIS — C189 Malignant neoplasm of colon, unspecified: Secondary | ICD-10-CM

## 2011-06-03 DIAGNOSIS — Z862 Personal history of diseases of the blood and blood-forming organs and certain disorders involving the immune mechanism: Secondary | ICD-10-CM

## 2011-06-03 DIAGNOSIS — E785 Hyperlipidemia, unspecified: Secondary | ICD-10-CM

## 2011-06-03 DIAGNOSIS — Z85038 Personal history of other malignant neoplasm of large intestine: Secondary | ICD-10-CM

## 2011-06-03 DIAGNOSIS — M109 Gout, unspecified: Secondary | ICD-10-CM

## 2011-06-03 LAB — COMPREHENSIVE METABOLIC PANEL
ALT: 27 U/L (ref 0–53)
Albumin: 3.9 g/dL (ref 3.5–5.2)
CO2: 30 mEq/L (ref 19–32)
Calcium: 10.6 mg/dL — ABNORMAL HIGH (ref 8.4–10.5)
Chloride: 103 mEq/L (ref 96–112)
Potassium: 3.8 mEq/L (ref 3.5–5.3)
Sodium: 141 mEq/L (ref 135–145)
Total Protein: 7.5 g/dL (ref 6.0–8.3)

## 2011-06-03 LAB — CBC WITH DIFFERENTIAL/PLATELET
BASO%: 0.3 % (ref 0.0–2.0)
Eosinophils Absolute: 0.2 10*3/uL (ref 0.0–0.5)
MCHC: 33.7 g/dL (ref 32.0–36.0)
MONO#: 0.8 10*3/uL (ref 0.1–0.9)
NEUT#: 4 10*3/uL (ref 1.5–6.5)
RBC: 4.33 10*6/uL (ref 4.20–5.82)
WBC: 6.9 10*3/uL (ref 4.0–10.3)
lymph#: 2 10*3/uL (ref 0.9–3.3)

## 2011-06-03 LAB — LACTATE DEHYDROGENASE: LDH: 172 U/L (ref 94–250)

## 2011-06-03 NOTE — Progress Notes (Signed)
CC:   Adolph Pollack, M.D. Petra Kuba, M.D. 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 [name] underwent resection and colostomy by Dr. Avel Peace on 06/11/2001.  He received adjuvant chemotherapy with 5-FU and leucovorin from January through June of 2003.  His colostomy was reversed on 05/13/2002.  [name] was last seen by [place] a year ago on 06/03/2010.  He tells me that at that time he was having some dizziness.  Workup was negative and his symptoms have resolved.  He denies any problems at the present time.  No obvious blood in the stools, any abdominal pain, change of bowel habit, or any sense of ill health.  He feels fine and has not had any significant health problems.  His last chest x-ray was on 06/03/2010.  His last colonoscopy was on 07/28/2008.  PROBLEM LIST: 1. Stage II adenocarcinoma of the left colon.  Presenting with colonic     obstruction, status post colostomy on 06/11/2001.  Adjuvant     chemotherapy with 5-FU leucovorin was given from January 2003 to     June 2003 with reversal of his colostomy on 05/13/2002.  He has     been disease-free since that time.  As stated, last colonoscopy was     on 07/28/2008. 2. History of kidney stones. 3. Dyslipidemia. 4. Gout. 5. History of alcohol usage. 6. History of iron-deficiency anemia dating back to December 2002.  MEDICINES: 1. Aspirin. 2. Fish oil omega-3 fatty acids. 3. Synthroid 50 mcg daily. 4. Multivitamins. 5. Naproxen. 6. Pravastatin 40 mg daily.  PHYSICAL EXAMINATION:  General Appearance:  [name] looks well.  Vital Signs:  His weight today is 175.1 pounds.  Height 5 feet and 10-1/2 inches.  Blood pressure 155/86 in the left arm sitting.  Other vital signs are normal.  HEENT:  There is no scleral icterus.  Mouth and pharynx are benign.  The patient most likely has rosacea,  possibly severe seborrheic dermatitis.  No peripheral adenopathy palpable. Heart:  Normal.  Lungs:  Normal.  Abdomen:  Benign with no organomegaly or masses palpable.  Extremities:  No peripheral edema or clubbing.  No calf tenderness.  Neurologic Exam:  Grossly normal.  LABORATORY DATA TODAY:  White count 6.9, ANC 4, hemoglobin 14.4, hematocrit 42.8 platelets 202,000.  Chemistries were normal, except for a calcium of 10.6.  CEA is pending.  CEA from 06/03/2010 was 2.2.  As stated, the patient had a chest x-ray on 06/03/2010 that was normal.  IMPRESSION AND PLAN:  [name] is now 76 years old and remains disease- free 10 years following diagnosis of his colon cancer.  I told [name] that he really did not need to come see [place] any more unless he wanted to. I also told him that he may need to have another colonoscopy sometime in the next year or 2 and to check with Dr. Ewing Schlein.  It has been a pleasure participating in the care of [name].    ______________________________ Samul Dada, M.D. DSM/MEDQ  D:  06/03/2011  T:  06/03/2011  Job:  [id number]

## 2011-06-03 NOTE — Progress Notes (Signed)
This office note has been dictated.  #[id number]

## 2012-03-21 ENCOUNTER — Other Ambulatory Visit: Payer: Self-pay | Admitting: Internal Medicine

## 2012-03-21 DIAGNOSIS — IMO0002 Reserved for concepts with insufficient information to code with codable children: Secondary | ICD-10-CM

## 2012-03-26 ENCOUNTER — Ambulatory Visit
Admission: RE | Admit: 2012-03-26 | Discharge: 2012-03-26 | Disposition: A | Discharge status: Home / Self Care | Payer: Medicare Other | Source: Ambulatory Visit | Attending: Internal Medicine | Admitting: Internal Medicine

## 2012-03-26 DIAGNOSIS — IMO0002 Reserved for concepts with insufficient information to code with codable children: Secondary | ICD-10-CM

## 2012-09-05 ENCOUNTER — Other Ambulatory Visit: Payer: Self-pay | Admitting: Internal Medicine

## 2012-09-07 ENCOUNTER — Ambulatory Visit
Admission: RE | Admit: 2012-09-07 | Discharge: 2012-09-07 | Disposition: A | Discharge status: Home / Self Care | Payer: Medicare Other | Source: Ambulatory Visit | Attending: Internal Medicine | Admitting: Internal Medicine

## 2012-11-05 ENCOUNTER — Other Ambulatory Visit: Payer: Self-pay | Admitting: Gastroenterology

## 2014-05-05 ENCOUNTER — Other Ambulatory Visit: Payer: Self-pay | Admitting: Internal Medicine

## 2014-05-05 DIAGNOSIS — R748 Abnormal levels of other serum enzymes: Secondary | ICD-10-CM

## 2014-05-12 ENCOUNTER — Ambulatory Visit
Admission: RE | Admit: 2014-05-12 | Discharge: 2014-05-12 | Disposition: A | Discharge status: Home / Self Care | Payer: Medicare Other | Source: Ambulatory Visit | Attending: Internal Medicine | Admitting: Internal Medicine

## 2014-05-12 DIAGNOSIS — R748 Abnormal levels of other serum enzymes: Secondary | ICD-10-CM

## 2014-07-28 DIAGNOSIS — Z79899 Other long term (current) drug therapy: Secondary | ICD-10-CM | POA: Diagnosis not present

## 2014-07-31 DIAGNOSIS — E039 Hypothyroidism, unspecified: Secondary | ICD-10-CM | POA: Diagnosis not present

## 2014-07-31 DIAGNOSIS — E780 Pure hypercholesterolemia: Secondary | ICD-10-CM | POA: Diagnosis not present

## 2014-07-31 DIAGNOSIS — R749 Abnormal serum enzyme level, unspecified: Secondary | ICD-10-CM | POA: Diagnosis not present

## 2014-07-31 DIAGNOSIS — I10 Essential (primary) hypertension: Secondary | ICD-10-CM | POA: Diagnosis not present

## 2014-09-30 DIAGNOSIS — R0602 Shortness of breath: Secondary | ICD-10-CM | POA: Diagnosis not present

## 2014-09-30 DIAGNOSIS — E785 Hyperlipidemia, unspecified: Secondary | ICD-10-CM | POA: Diagnosis not present

## 2014-09-30 DIAGNOSIS — Z79899 Other long term (current) drug therapy: Secondary | ICD-10-CM | POA: Diagnosis not present

## 2014-09-30 DIAGNOSIS — R079 Chest pain, unspecified: Secondary | ICD-10-CM | POA: Diagnosis not present

## 2014-10-29 DIAGNOSIS — R749 Abnormal serum enzyme level, unspecified: Secondary | ICD-10-CM | POA: Diagnosis not present

## 2014-10-29 DIAGNOSIS — I10 Essential (primary) hypertension: Secondary | ICD-10-CM | POA: Diagnosis not present

## 2014-11-04 DIAGNOSIS — E039 Hypothyroidism, unspecified: Secondary | ICD-10-CM | POA: Diagnosis not present

## 2014-11-04 DIAGNOSIS — I10 Essential (primary) hypertension: Secondary | ICD-10-CM | POA: Diagnosis not present

## 2014-11-04 DIAGNOSIS — M109 Gout, unspecified: Secondary | ICD-10-CM | POA: Diagnosis not present

## 2014-11-04 DIAGNOSIS — E780 Pure hypercholesterolemia: Secondary | ICD-10-CM | POA: Diagnosis not present

## 2015-04-07 DIAGNOSIS — E785 Hyperlipidemia, unspecified: Secondary | ICD-10-CM | POA: Diagnosis not present

## 2015-04-07 DIAGNOSIS — R079 Chest pain, unspecified: Secondary | ICD-10-CM | POA: Diagnosis not present

## 2015-04-07 DIAGNOSIS — R0602 Shortness of breath: Secondary | ICD-10-CM | POA: Diagnosis not present

## 2015-04-07 DIAGNOSIS — F17200 Nicotine dependence, unspecified, uncomplicated: Secondary | ICD-10-CM | POA: Diagnosis not present

## 2015-05-06 DIAGNOSIS — Z125 Encounter for screening for malignant neoplasm of prostate: Secondary | ICD-10-CM | POA: Diagnosis not present

## 2015-05-06 DIAGNOSIS — E039 Hypothyroidism, unspecified: Secondary | ICD-10-CM | POA: Diagnosis not present

## 2015-05-06 DIAGNOSIS — M109 Gout, unspecified: Secondary | ICD-10-CM | POA: Diagnosis not present

## 2015-05-06 DIAGNOSIS — I10 Essential (primary) hypertension: Secondary | ICD-10-CM | POA: Diagnosis not present

## 2015-08-19 DIAGNOSIS — Z1389 Encounter for screening for other disorder: Secondary | ICD-10-CM | POA: Diagnosis not present

## 2015-08-19 DIAGNOSIS — E039 Hypothyroidism, unspecified: Secondary | ICD-10-CM | POA: Diagnosis not present

## 2015-08-19 DIAGNOSIS — E785 Hyperlipidemia, unspecified: Secondary | ICD-10-CM | POA: Diagnosis not present

## 2015-08-19 DIAGNOSIS — I10 Essential (primary) hypertension: Secondary | ICD-10-CM | POA: Diagnosis not present

## 2016-08-17 DIAGNOSIS — E785 Hyperlipidemia, unspecified: Secondary | ICD-10-CM | POA: Diagnosis not present

## 2016-08-17 DIAGNOSIS — I10 Essential (primary) hypertension: Secondary | ICD-10-CM | POA: Diagnosis not present

## 2016-08-17 DIAGNOSIS — E039 Hypothyroidism, unspecified: Secondary | ICD-10-CM | POA: Diagnosis not present

## 2016-08-22 DIAGNOSIS — Z0000 Encounter for general adult medical examination without abnormal findings: Secondary | ICD-10-CM | POA: Diagnosis not present

## 2016-08-22 DIAGNOSIS — I10 Essential (primary) hypertension: Secondary | ICD-10-CM | POA: Diagnosis not present

## 2016-08-22 DIAGNOSIS — E039 Hypothyroidism, unspecified: Secondary | ICD-10-CM | POA: Diagnosis not present

## 2017-08-15 DIAGNOSIS — Z125 Encounter for screening for malignant neoplasm of prostate: Secondary | ICD-10-CM | POA: Diagnosis not present

## 2017-08-15 DIAGNOSIS — N390 Urinary tract infection, site not specified: Secondary | ICD-10-CM | POA: Diagnosis not present

## 2017-08-15 DIAGNOSIS — E039 Hypothyroidism, unspecified: Secondary | ICD-10-CM | POA: Diagnosis not present

## 2017-08-15 DIAGNOSIS — I10 Essential (primary) hypertension: Secondary | ICD-10-CM | POA: Diagnosis not present

## 2017-08-15 DIAGNOSIS — Z0000 Encounter for general adult medical examination without abnormal findings: Secondary | ICD-10-CM | POA: Diagnosis not present

## 2017-08-23 DIAGNOSIS — Z0000 Encounter for general adult medical examination without abnormal findings: Secondary | ICD-10-CM | POA: Diagnosis not present

## 2017-08-23 DIAGNOSIS — E039 Hypothyroidism, unspecified: Secondary | ICD-10-CM | POA: Diagnosis not present

## 2017-08-23 DIAGNOSIS — I10 Essential (primary) hypertension: Secondary | ICD-10-CM | POA: Diagnosis not present

## 2017-08-25 DIAGNOSIS — L570 Actinic keratosis: Secondary | ICD-10-CM | POA: Diagnosis not present

## 2018-02-08 ENCOUNTER — Emergency Department (HOSPITAL_COMMUNITY): Payer: Medicare Other

## 2018-02-08 ENCOUNTER — Emergency Department (HOSPITAL_COMMUNITY)
Admission: EM | Admit: 2018-02-08 | Discharge: 2018-02-08 | Disposition: A | Discharge status: Home / Self Care | Payer: Medicare Other | Attending: Emergency Medicine | Admitting: Emergency Medicine

## 2018-02-08 ENCOUNTER — Encounter (HOSPITAL_COMMUNITY): Payer: Self-pay | Admitting: Emergency Medicine

## 2018-02-08 DIAGNOSIS — Z23 Encounter for immunization: Secondary | ICD-10-CM | POA: Insufficient documentation

## 2018-02-08 DIAGNOSIS — W1789XA Other fall from one level to another, initial encounter: Secondary | ICD-10-CM | POA: Diagnosis not present

## 2018-02-08 DIAGNOSIS — S0990XA Unspecified injury of head, initial encounter: Secondary | ICD-10-CM | POA: Diagnosis not present

## 2018-02-08 DIAGNOSIS — Y92008 Other place in unspecified non-institutional (private) residence as the place of occurrence of the external cause: Secondary | ICD-10-CM | POA: Insufficient documentation

## 2018-02-08 DIAGNOSIS — Y9389 Activity, other specified: Secondary | ICD-10-CM | POA: Insufficient documentation

## 2018-02-08 DIAGNOSIS — Z7982 Long term (current) use of aspirin: Secondary | ICD-10-CM | POA: Diagnosis not present

## 2018-02-08 DIAGNOSIS — S199XXA Unspecified injury of neck, initial encounter: Secondary | ICD-10-CM | POA: Diagnosis not present

## 2018-02-08 DIAGNOSIS — R220 Localized swelling, mass and lump, head: Secondary | ICD-10-CM | POA: Diagnosis not present

## 2018-02-08 DIAGNOSIS — Z79899 Other long term (current) drug therapy: Secondary | ICD-10-CM | POA: Insufficient documentation

## 2018-02-08 DIAGNOSIS — R51 Headache: Secondary | ICD-10-CM | POA: Diagnosis not present

## 2018-02-08 DIAGNOSIS — W19XXXA Unspecified fall, initial encounter: Secondary | ICD-10-CM

## 2018-02-08 DIAGNOSIS — S0181XA Laceration without foreign body of other part of head, initial encounter: Secondary | ICD-10-CM | POA: Diagnosis not present

## 2018-02-08 DIAGNOSIS — S6991XA Unspecified injury of right wrist, hand and finger(s), initial encounter: Secondary | ICD-10-CM | POA: Diagnosis not present

## 2018-02-08 DIAGNOSIS — Y999 Unspecified external cause status: Secondary | ICD-10-CM | POA: Insufficient documentation

## 2018-02-08 DIAGNOSIS — S022XXA Fracture of nasal bones, initial encounter for closed fracture: Secondary | ICD-10-CM | POA: Diagnosis not present

## 2018-02-08 HISTORY — DX: Disorder of thyroid, unspecified: E07.9

## 2018-02-08 LAB — CBC
HCT: 43.5 % (ref 39.0–52.0)
Hemoglobin: 14.8 g/dL (ref 13.0–17.0)
MCH: 36.6 pg — ABNORMAL HIGH (ref 26.0–34.0)
MCHC: 34 g/dL (ref 30.0–36.0)
MCV: 107.7 fL — AB (ref 78.0–100.0)
PLATELETS: 186 10*3/uL (ref 150–400)
RBC: 4.04 MIL/uL — AB (ref 4.22–5.81)
RDW: 13.4 % (ref 11.5–15.5)
WBC: 11 10*3/uL — AB (ref 4.0–10.5)

## 2018-02-08 LAB — BASIC METABOLIC PANEL
Anion gap: 10 (ref 5–15)
BUN: 10 mg/dL (ref 8–23)
CHLORIDE: 107 mmol/L (ref 98–111)
CO2: 24 mmol/L (ref 22–32)
CREATININE: 0.92 mg/dL (ref 0.61–1.24)
Calcium: 9.1 mg/dL (ref 8.9–10.3)
Glucose, Bld: 97 mg/dL (ref 70–99)
Potassium: 4.3 mmol/L (ref 3.5–5.1)
SODIUM: 141 mmol/L (ref 135–145)

## 2018-02-08 LAB — TROPONIN I: Troponin I: <0.03 ng/mL (ref <0.03)

## 2018-02-08 LAB — CK: Total CK: 212 U/L (ref 49–397)

## 2018-02-08 MED ORDER — TETANUS-DIPHTH-ACELL PERTUSSIS 5-2.5-18.5 LF-MCG/0.5 IM SUSP
0.5000 mL | Freq: Once | INTRAMUSCULAR | Status: AC
  Administered 2018-02-08: 0.5 mL via INTRAMUSCULAR
  Filled 2018-02-08: qty 0.5

## 2018-02-08 NOTE — Discharge Instructions (Addendum)
Continue taking home medications as prescribed. Wash your wounds daily with soap and water. Follow-up with your primary care doctor for any signs of infection including fevers, increasing redness, pus draining from the area. You likely have continued stiffness and soreness over the next couple days, use Tylenol or ibuprofen as needed for pain. Return to the emergency room with any new, worsening, or concerning symptoms.  Return if you are having severe headaches, vision changes, vomiting, loss of bowel bladder control.

## 2018-02-08 NOTE — ED Provider Notes (Signed)
Patient placed in Quick Look pathway, seen and evaluated   Chief Complaint: fall  HPI: [name] is a 82 y.o. male who presents to the ED with his daughter after falling sometime last night. Patient reports he was sitting in a cheap plastic chair last night outside smoking. Patient reports he thinks the injury occurred about 1 am and when he woke up and looked at his watch it was 4 am. Patient was unable to get up and was there until this morning when his son found him laying in the gravel about 10 am. Patient does not take blood thinner. He is unsure of last tetanus.   ROS: Neuro: headache  Skin: wounds  Neck: pain  M/S: right wrist pain  Head: facial abrasions, pain in nose Exam: BP (!) 155/98 (BP Location: Left Arm)   Pulse 86   Temp 98 F (36.7 C) (Oral)   Resp 16   SpO2 99%    Gen: No distress  Neuro: Awake and Alert  Skin: multiple facial abrasions  HEENT: swelling of the nose, abrasion to forehead  Neck: tenderness   Initiation of care has begun. The patient has been counseled on the process, plan, and necessity for staying for the completion/evaluation, and the remainder of the medical screening examination    Ashley Murrain, NP 02/08/18 [place]    Julianne Rice, MD 02/09/18 [phone]

## 2018-02-08 NOTE — ED Triage Notes (Signed)
Patient from home with daughter, patient lives alone and fell sometimes last night. Patient alert and oriented at this time, patient does not remember falling. Patient complains of pain when he presses on his nose, pain with movement of right wrist. Daughter states large pool of blood on floor at patient's home.

## 2018-02-08 NOTE — ED Provider Notes (Signed)
[facility] DEPART[facility] Provider Note   CSN: [id number] Arrival date & time: 02/08/18  1242     History   Chief Complaint Chief Complaint  Patient presents with  . Fall    HPI [name] is a 83 y.o. male presenting for evaluation after a fall.  Patient states he was sitting on his porch last night around 1 AM.  He thinks he might of falling asleep and tried to stand up at some point.  Around 4 AM, he himself laying on the gravel.  He was unable to stand up, which he states is normal baseline.  He was lying on the ground until 10 AM when his son found him.  He does not know what caused him to fall, but the chair was tipped over, and he thinks he might of stood up too quickly or tripped.  He reports one 40 ounce beer last night, no other alcohol.  He denies headache, vision changes, slurred speech, neck pain, back pain, chest pain, shortness of breath, nausea, vomiting, abdominal pain, loss of bowel or bladder control, numbness, tingling.  He reports pain of his nose, right wrist, left knee.  He is not on blood thinners.  He has not taken anything for pain, does not want anything at this time.  Pain is mild, worse with palpation.  HPI  Past Medical History:  Diagnosis Date  . Anemia    iron deficient  . Dyslipidemia   . ETOH abuse   . Gout   . Kidney stone    right  . Malignant neoplasm of colon, unspecified site   . Thyroid disease     Patient Active Problem List   Diagnosis Date Noted  . Colon cancer ([facility]) 06/03/2011  . Kidney stone     Past Surgical History:  Procedure Laterality Date  . COLOSTOMY  06/11/01  . colostomy reversal  05/13/02        Home Medications    Prior to Admission medications   Medication Sig Start Date End Date Taking? Authorizing Provider  aspirin 81 MG tablet Take 162 mg by mouth daily.      [provider]  fish oil-omega-3 fatty acids 1000 MG capsule Take 2 g by mouth daily.      [provider]  levothyroxine (SYNTHROID, LEVOTHROID) 50 MCG tablet Take 50 mcg by mouth daily.      [provider]  Multiple Vitamin (MULTIVITAMIN) tablet Take 1 tablet by mouth daily.     [provider]  naproxen (NAPROSYN) 500 MG tablet Take 500 mg by mouth as needed.      [provider]  pravastatin (PRAVACHOL) 40 MG tablet Take 40 mg by mouth daily.      [provider]    Family History No family history on file.  Social History Social History   Tobacco Use  . Smoking status: Not on file  Substance Use Topics  . Alcohol use: Not on file  . Drug use: Not on file     Allergies   Demerol and Morphine and related   Review of Systems Review of Systems  HENT:       Nasal pain and facial lacerations  Musculoskeletal: Positive for arthralgias.  All other systems reviewed and are negative.    Physical Exam Updated Vital Signs BP (!) 138/57   Pulse 67   Temp 98 F (36.7 C) (Oral)   Resp 16   SpO2 97%  Physical Exam  Constitutional: He is oriented to person, place, and time. He appears well-developed and well-nourished. No distress.  Resting comfortably in the bed in no acute distress.  HENT:  Head: Normocephalic.  Right Ear: Tympanic membrane, external ear and ear canal normal.  Left Ear: External ear and ear canal normal.  Nose: Sinus tenderness present.  Mouth/Throat: Uvula is midline, oropharynx is clear and moist and mucous membranes are normal.  Multiple facial abrasions, mostly of the forehead and nose.  No suturable lacerations.  No malocclusion.  Nose nasal septal hematoma.  No hemotympanum.  No tenderness to palpation of the orbits.  Tenderness palpation of the nasal bones.  Eyes: Pupils are equal, round, and reactive to light. Conjunctivae and EOM are normal.  EOMI and PERRLA.  No nystagmus.  No entrapment.  Neck: Normal range of motion. Neck supple.  No tenderness palpation of midline C-spine.  Full active range of  motion of the head without pain.  Cardiovascular: Normal rate, regular rhythm and intact distal pulses.  Pulmonary/Chest: Effort normal and breath sounds normal. No respiratory distress. He has no wheezes.  Speaking in full sentences.  Clear lung sounds in all fields.  No tenderness palpation of the chest wall.  Abdominal: Soft. He exhibits no distension and no mass. There is no tenderness. There is no guarding.  Abdomen soft and nontender.  No obvious bruising.  Musculoskeletal: Normal range of motion. He exhibits tenderness.  Full active range of motion of upper and lower extremities without difficulty.  Grip strength intact bilaterally.  Radial pedal pulses intact bilaterally.  Station intact x4.  Patient is ambulatory without difficulty.  No tenderness palpation of the back. Tenderness palpation of the right wrist without obvious deformity.  Abrasion overlying the left anterior knee without tenderness to palpation of bony protuberances.  Neurological: He is alert and oriented to person, place, and time. No sensory deficit.  Skin: Skin is warm and dry. Capillary refill takes less than 2 seconds.  Psychiatric: He has a normal mood and affect.  Nursing note and vitals reviewed.    ED Treatments / Results  Labs (all labs ordered are listed, but only abnormal results are displayed) Labs Reviewed  CBC - Abnormal; Notable for the following components:      Result Value   WBC 11.0 (*)    RBC 4.04 (*)    MCV 107.7 (*)    MCH 36.6 (*)    All other components within normal limits  BASIC METABOLIC PANEL  TROPONIN I  CK  URINALYSIS, ROUTINE W REFLEX MICROSCOPIC  CBG MONITORING, ED    EKG EKG Interpretation  Date/Time:  Thursday February 08 2018 13:10:50 EDT Ventricular Rate:  76 PR Interval:  148 QRS Duration: 82 QT Interval:  380 QTC Calculation: 427 R Axis:   -41 Text Interpretation:  ** Poor data quality, interpretation may be adversely affected Normal sinus rhythm Left axis  deviation Nonspecific ST and T wave abnormality Abnormal ECG Interpretation limited secondary to artifact however, barring this, appears similar to 2009 Confirmed by Sherwood Gambler [phone]) on 02/08/2018 3:34:53 PM   Radiology Dg Wrist Complete Right  Result Date: 02/08/2018 CLINICAL DATA:  83 year old who fell out of a chair outside in his gravel driveway last night, injuring the RIGHT wrist. The patient was unable to get up from the fall on his own. Initial encounter. EXAM: RIGHT WRIST - COMPLETE 3+ VIEW COMPARISON:  None. FINDINGS: Remote fracture involving the distal radius with normal healing. Remote ulnar styloid  fracture with nonunion. No evidence of acute fracture or dislocation. Moderate radiocarpal joint space narrowing. Intercarpal joint spaces well-preserved. Mild narrowing of the trapezium-first metacarpal joint. IMPRESSION: 1. No acute osseous abnormality. 2. Remote fracture involving the distal radius with normal healing. Remote ulnar styloid fracture with nonunion. 3. Osteoarthritis. Electronically Signed   By: Evangeline Dakin M.D.   On: 02/08/2018 14:00   Ct Head Wo Contrast  Result Date: 02/08/2018 CLINICAL DATA:  Head and neck trauma with facial pain and swelling. EXAM: CT HEAD WITHOUT CONTRAST CT MAXILLOFACIAL WITHOUT CONTRAST CT CERVICAL SPINE WITHOUT CONTRAST TECHNIQUE: Multidetector CT imaging of the head, cervical spine, and maxillofacial structures were performed using the standard protocol without intravenous contrast. Multiplanar CT image reconstructions of the cervical spine and maxillofacial structures were also generated. COMPARISON:  CT and MRI exam from 2011. FINDINGS: CT HEAD FINDINGS Brain: Ordinary age related volume loss. No evidence of old or acute focal infarction, mass lesion, hemorrhage, hydrocephalus or extra-axial collection. Vascular: There is atherosclerotic calcification of the major vessels at the base of the brain. Skull: No skull fracture. Other: Frontal  scalp swelling. CT MAXILLOFACIAL FINDINGS Osseous: Minor nasal bone fractures.  No other facial fracture seen. Orbits: No traumatic orbital finding. Sinuses: Clear Soft tissues: Nasal and forehead soft tissue swelling. CT CERVICAL SPINE FINDINGS Alignment: Normal Skull base and vertebrae: No fracture or traumatic malalignment. Soft tissues and spinal canal: No soft tissue lesions seen. Carotid bifurcation calcification. Disc levels: Ordinary degenerative spondylosis with endplate osteophytes at C3-4, C4-5, C5-6 and C6-7. No facet arthropathy. There is ordinary osteoarthritis at the C1-2 articulation. Upper chest: Minimal apical scarring. Other: None IMPRESSION: Head CT: Age related atrophy.  No acute or traumatic finding. Maxillofacial CT: Minimal fractures at the tips of the nasal bones. No other facial fracture. Mild swelling of the nose and forehead. Cervical spine CT: No acute or traumatic finding. Ordinary degenerative changes, typical for age. Electronically Signed   By: Nelson Chimes M.D.   On: 02/08/2018 14:24   Ct Cervical Spine Wo Contrast  Result Date: 02/08/2018 CLINICAL DATA:  Head and neck trauma with facial pain and swelling. EXAM: CT HEAD WITHOUT CONTRAST CT MAXILLOFACIAL WITHOUT CONTRAST CT CERVICAL SPINE WITHOUT CONTRAST TECHNIQUE: Multidetector CT imaging of the head, cervical spine, and maxillofacial structures were performed using the standard protocol without intravenous contrast. Multiplanar CT image reconstructions of the cervical spine and maxillofacial structures were also generated. COMPARISON:  CT and MRI exam from 2011. FINDINGS: CT HEAD FINDINGS Brain: Ordinary age related volume loss. No evidence of old or acute focal infarction, mass lesion, hemorrhage, hydrocephalus or extra-axial collection. Vascular: There is atherosclerotic calcification of the major vessels at the base of the brain. Skull: No skull fracture. Other: Frontal scalp swelling. CT MAXILLOFACIAL FINDINGS Osseous:  Minor nasal bone fractures.  No other facial fracture seen. Orbits: No traumatic orbital finding. Sinuses: Clear Soft tissues: Nasal and forehead soft tissue swelling. CT CERVICAL SPINE FINDINGS Alignment: Normal Skull base and vertebrae: No fracture or traumatic malalignment. Soft tissues and spinal canal: No soft tissue lesions seen. Carotid bifurcation calcification. Disc levels: Ordinary degenerative spondylosis with endplate osteophytes at C3-4, C4-5, C5-6 and C6-7. No facet arthropathy. There is ordinary osteoarthritis at the C1-2 articulation. Upper chest: Minimal apical scarring. Other: None IMPRESSION: Head CT: Age related atrophy.  No acute or traumatic finding. Maxillofacial CT: Minimal fractures at the tips of the nasal bones. No other facial fracture. Mild swelling of the nose and forehead. Cervical spine CT: No acute  or traumatic finding. Ordinary degenerative changes, typical for age. Electronically Signed   By: Nelson Chimes M.D.   On: 02/08/2018 14:24   Ct Maxillofacial Wo Contrast  Result Date: 02/08/2018 CLINICAL DATA:  Head and neck trauma with facial pain and swelling. EXAM: CT HEAD WITHOUT CONTRAST CT MAXILLOFACIAL WITHOUT CONTRAST CT CERVICAL SPINE WITHOUT CONTRAST TECHNIQUE: Multidetector CT imaging of the head, cervical spine, and maxillofacial structures were performed using the standard protocol without intravenous contrast. Multiplanar CT image reconstructions of the cervical spine and maxillofacial structures were also generated. COMPARISON:  CT and MRI exam from 2011. FINDINGS: CT HEAD FINDINGS Brain: Ordinary age related volume loss. No evidence of old or acute focal infarction, mass lesion, hemorrhage, hydrocephalus or extra-axial collection. Vascular: There is atherosclerotic calcification of the major vessels at the base of the brain. Skull: No skull fracture. Other: Frontal scalp swelling. CT MAXILLOFACIAL FINDINGS Osseous: Minor nasal bone fractures.  No other facial fracture  seen. Orbits: No traumatic orbital finding. Sinuses: Clear Soft tissues: Nasal and forehead soft tissue swelling. CT CERVICAL SPINE FINDINGS Alignment: Normal Skull base and vertebrae: No fracture or traumatic malalignment. Soft tissues and spinal canal: No soft tissue lesions seen. Carotid bifurcation calcification. Disc levels: Ordinary degenerative spondylosis with endplate osteophytes at C3-4, C4-5, C5-6 and C6-7. No facet arthropathy. There is ordinary osteoarthritis at the C1-2 articulation. Upper chest: Minimal apical scarring. Other: None IMPRESSION: Head CT: Age related atrophy.  No acute or traumatic finding. Maxillofacial CT: Minimal fractures at the tips of the nasal bones. No other facial fracture. Mild swelling of the nose and forehead. Cervical spine CT: No acute or traumatic finding. Ordinary degenerative changes, typical for age. Electronically Signed   By: Nelson Chimes M.D.   On: 02/08/2018 14:24    Procedures Procedures (including critical care time)  Medications Ordered in ED Medications  Tdap (BOOSTRIX) injection 0.5 mL (0.5 mLs Intramuscular Given 02/08/18 1548)     Initial Impression / Assessment and Plan / ED Course  I have reviewed the triage vital signs and the nursing notes.  Pertinent labs & imaging results that were available during my care of the patient were reviewed by me and considered in my medical decision making (see chart for details).     Patient presented for evaluation after a fall.  Physical exam reassuring, no obvious neurologic deficits.  Multiple abrasions noted on the face and the left knee, nothing that needs repair.  Tetanus updated.  CT head, neck, maxillofacial shows nasal fracture without intracranial bleed, skull fracture, or cervical abnormality.  X-ray of the right wrist without acute fracture.  Patient is ambulatory on the left knee without pain with full active range of motion, doubt fracture dislocation.  Wounds cleaned and wound care  instructions given.  Doubt intracranial, intrapulmonary, or intra-abdominal injury.  Labs reassuring, hemoglobin stable.  Troponin negative, CK normal.  EKG without acute findings.  Case discussed with attending, Dr. Regenia Skeeter agrees to plan.  It is likely musculoskeletal, and syncopal event was likely either orthostatic or mechanical.  No obvious medical etiology for the fall.  At this time, patient appears safe for discharge.  Return precautions given.  Patient states he understands and agrees plan.  Final Clinical Impressions(s) / ED Diagnoses   Final diagnoses:  Fall, initial encounter  Facial laceration, initial encounter    ED Discharge Orders    None       Franchot Heidelberg, PA-C 02/08/18 1728    Sherwood Gambler, MD 02/08/18 2237

## 2018-08-09 DIAGNOSIS — H2513 Age-related nuclear cataract, bilateral: Secondary | ICD-10-CM | POA: Diagnosis not present

## 2018-08-15 DIAGNOSIS — I10 Essential (primary) hypertension: Secondary | ICD-10-CM | POA: Diagnosis not present

## 2018-08-15 DIAGNOSIS — Z0000 Encounter for general adult medical examination without abnormal findings: Secondary | ICD-10-CM | POA: Diagnosis not present

## 2018-08-15 DIAGNOSIS — E039 Hypothyroidism, unspecified: Secondary | ICD-10-CM | POA: Diagnosis not present

## 2018-08-15 DIAGNOSIS — Z79899 Other long term (current) drug therapy: Secondary | ICD-10-CM | POA: Diagnosis not present

## 2018-08-27 DIAGNOSIS — H2512 Age-related nuclear cataract, left eye: Secondary | ICD-10-CM | POA: Diagnosis not present

## 2018-08-31 DIAGNOSIS — E781 Pure hyperglyceridemia: Secondary | ICD-10-CM | POA: Diagnosis not present

## 2018-08-31 DIAGNOSIS — Z0000 Encounter for general adult medical examination without abnormal findings: Secondary | ICD-10-CM | POA: Diagnosis not present

## 2018-08-31 DIAGNOSIS — I10 Essential (primary) hypertension: Secondary | ICD-10-CM | POA: Diagnosis not present

## 2018-08-31 DIAGNOSIS — E039 Hypothyroidism, unspecified: Secondary | ICD-10-CM | POA: Diagnosis not present

## 2018-09-10 DIAGNOSIS — H2511 Age-related nuclear cataract, right eye: Secondary | ICD-10-CM | POA: Diagnosis not present

## 2018-09-10 DIAGNOSIS — H5703 Miosis: Secondary | ICD-10-CM | POA: Diagnosis not present

## 2018-12-12 DIAGNOSIS — H35351 Cystoid macular degeneration, right eye: Secondary | ICD-10-CM | POA: Diagnosis not present

## 2018-12-22 DIAGNOSIS — H40033 Anatomical narrow angle, bilateral: Secondary | ICD-10-CM | POA: Diagnosis not present

## 2018-12-22 DIAGNOSIS — H2513 Age-related nuclear cataract, bilateral: Secondary | ICD-10-CM | POA: Diagnosis not present

## 2019-01-02 DIAGNOSIS — Z961 Presence of intraocular lens: Secondary | ICD-10-CM | POA: Diagnosis not present

## 2019-01-02 DIAGNOSIS — H35351 Cystoid macular degeneration, right eye: Secondary | ICD-10-CM | POA: Diagnosis not present

## 2019-01-22 DIAGNOSIS — H59031 Cystoid macular edema following cataract surgery, right eye: Secondary | ICD-10-CM | POA: Diagnosis not present

## 2019-01-22 DIAGNOSIS — H35071 Retinal telangiectasis, right eye: Secondary | ICD-10-CM | POA: Diagnosis not present

## 2019-01-22 DIAGNOSIS — H35072 Retinal telangiectasis, left eye: Secondary | ICD-10-CM | POA: Diagnosis not present

## 2019-01-22 DIAGNOSIS — H35351 Cystoid macular degeneration, right eye: Secondary | ICD-10-CM | POA: Diagnosis not present

## 2019-01-30 DIAGNOSIS — R0681 Apnea, not elsewhere classified: Secondary | ICD-10-CM | POA: Diagnosis not present

## 2019-01-30 DIAGNOSIS — E039 Hypothyroidism, unspecified: Secondary | ICD-10-CM | POA: Diagnosis not present

## 2019-01-30 DIAGNOSIS — I10 Essential (primary) hypertension: Secondary | ICD-10-CM | POA: Diagnosis not present

## 2019-01-30 DIAGNOSIS — E785 Hyperlipidemia, unspecified: Secondary | ICD-10-CM | POA: Diagnosis not present

## 2019-02-18 ENCOUNTER — Encounter: Payer: Self-pay | Admitting: Neurology

## 2019-02-20 ENCOUNTER — Other Ambulatory Visit: Payer: Self-pay

## 2019-02-20 ENCOUNTER — Encounter: Payer: Self-pay | Admitting: Neurology

## 2019-02-20 ENCOUNTER — Ambulatory Visit (INDEPENDENT_AMBULATORY_CARE_PROVIDER_SITE_OTHER): Payer: Medicare Other | Admitting: Neurology

## 2019-02-20 VITALS — BP 164/60 | HR 57 | Temp 97.8°F | Ht 70.0 in | Wt 163.0 lb

## 2019-02-20 DIAGNOSIS — E039 Hypothyroidism, unspecified: Secondary | ICD-10-CM | POA: Insufficient documentation

## 2019-02-20 DIAGNOSIS — R0683 Snoring: Secondary | ICD-10-CM | POA: Diagnosis not present

## 2019-02-20 DIAGNOSIS — H3581 Retinal edema: Secondary | ICD-10-CM

## 2019-02-20 DIAGNOSIS — C188 Malignant neoplasm of overlapping sites of colon: Secondary | ICD-10-CM

## 2019-02-20 DIAGNOSIS — M1A0491 Idiopathic chronic gout, unspecified hand, with tophus (tophi): Secondary | ICD-10-CM

## 2019-02-20 DIAGNOSIS — E038 Other specified hypothyroidism: Secondary | ICD-10-CM | POA: Diagnosis not present

## 2019-02-20 DIAGNOSIS — Z789 Other specified health status: Secondary | ICD-10-CM | POA: Insufficient documentation

## 2019-02-20 DIAGNOSIS — Z933 Colostomy status: Secondary | ICD-10-CM

## 2019-02-20 DIAGNOSIS — F17290 Nicotine dependence, other tobacco product, uncomplicated: Secondary | ICD-10-CM | POA: Insufficient documentation

## 2019-02-20 DIAGNOSIS — Z7289 Other problems related to lifestyle: Secondary | ICD-10-CM

## 2019-02-20 DIAGNOSIS — M109 Gout, unspecified: Secondary | ICD-10-CM | POA: Insufficient documentation

## 2019-02-20 NOTE — Patient Instructions (Signed)

## 2019-02-20 NOTE — Progress Notes (Signed)
SLEEP MEDICINE CLINIC    Provider:  Larey Seat, MD  Primary Care Physician:  [name], [address] [place] [address]     Referring Provider: Jani Hawkins, [address] [place],  [place] [address]   Evan Lair, MD retinal specialist.          Chief Complaint according to patient   Patient presents with:    . New Patient (Initial Visit)           HISTORY OF PRESENT ILLNESS:  [name] is a 84 y.o. year old  Caucasian male patient seen here in the company of his daughter  [name]) upon a referral form Dr [name] and Dr [name] on 02/20/2019.   Chief concern according to patient : Mr. [name] presented first to an optometrist thinking that he needed new glasses.  After the exam with the optician he was referred for cataract evaluation which ended up in cataract surgery.  After his cataracts were treated it was evident that he had fluid leakage and retinal vascular congestion.  Dr. Zadie Hawkins asked about symptoms and signs of sleep apnea to be present of which there are some of the patient was therefore referred for a sleep apnea evaluation.   I have the pleasure of seeing [name] today, a  right-handed White or Caucasian male with a possible sleep disorder who also  has a past medical history of Anemia, Dyslipidemia, ETOH abuse, Gout, Hypertension, Kidney stone, Malignant neoplasm of colon, unspecified site, Neuropathy, and Thyroid disease..     Sleep relevant medical history: Nocturia 1-2, Sleep walking: none. . Tonsillectomy; at age 68 .Family medical /sleep history: No other family member with OSA.Marland Kitchen    Social history:  Patient is retired from [employer] and lives in a household with 2, the other being his son. Family status is widowed, with 3 adult children.   Tobacco use; pipe smoker - 60 years  ETOH use beer every night. Caffeine intake in form of Coffee( none ) Soda( mountain dew 16 ounces a day) Tea ( when  in a restaurant ) no energy drinks. No regular exercise .          Sleep habits are as follows: The patient's dinner time is between 4.30-5  PM. The patient goes to bed at 9.30 PM and reports no trouble to go to sleep. The bedroom is cool, quiet and dark. He  continues to sleep for 4-5hours, wakes for one bathroom break.   The preferred sleep position is lateral , with the support of 2 pillows. Dreams are reportedly frequent/vivid. The patient wakes up spontaneously at 7 without an alarm. 7.15 AM is the usual rise time.  He reports not feeling refreshed or restored in AM, without symptoms such as dry mouth neither does he report morning headaches.  Naps are taken frequently, lasting from 11.30-12.30  and he doesn't eat lunch- but drinks 3 Ensures. naps are  refreshing .    Review of Systems: Out of a complete 14 system review, the patient complains of only the following symptoms, and all other reviewed systems are negative.:   snoring, fragmented sleep. Loudly snoring, gasping but unaware.   How likely are you to doze in the following situations: 0 = not likely, 1 = slight chance, 2 = moderate chance, 3 = high chance   Sitting and Reading? Watching Television? Sitting inactive in a public place (theater or meeting)? As a  passenger in a car for an hour without a break? Lying down in the afternoon when circumstances permit? Sitting and talking to someone? Sitting quietly after lunch without alcohol? In a car, while stopped for a few minutes in traffic?   Total = 5/ 24 points   FSS endorsed at 19 / 63 points. GDS 7/ 15 depression.    Social History   Socioeconomic History  . Marital status: Married    Spouse name: Not on file  . Number of children: Not on file  . Years of education: Not on file  . Highest education level: Not on file  Occupational History  . Not on file  Social Needs  . Financial resource strain: Not on file  . Food insecurity    Worry: Not on file     Inability: Not on file  . Transportation needs    Medical: Not on file    Non-medical: Not on file  Tobacco Use  . Smoking status: Current Every Day Smoker    Years: 45.00    Types: Pipe  . Smokeless tobacco: Never Used  Substance and Sexual Activity  . Alcohol use: Yes    Comment: occasional  . Drug use: Not Currently  . Sexual activity: Not on file  Lifestyle  . Physical activity    Days per week: Not on file    Minutes per session: Not on file  . Stress: Not on file  Relationships  . Social Herbalist on phone: Not on file    Gets together: Not on file    Attends religious service: Not on file    Active member of club or organization: Not on file    Attends meetings of clubs or organizations: Not on file    Relationship status: Not on file  Other Topics Concern  . Not on file  Social History Narrative  . Not on file    Family History  Problem Relation Age of Onset  . Ovarian cancer Mother   . Heart attack Father   . Hyperlipidemia Brother     Past Medical History:  Diagnosis Date  . Anemia    iron deficient  . Dyslipidemia   . ETOH abuse   . Gout   . Hypertension   . Kidney stone    right  . Malignant neoplasm of colon, unspecified site   . Neuropathy   . Thyroid disease     Past Surgical History:  Procedure Laterality Date  . CATARACT EXTRACTION W/ INTRAOCULAR LENS  IMPLANT, BILATERAL    . COLONOSCOPY    . COLOSTOMY  06/11/01  . colostomy reversal  05/13/02     Current Outpatient Medications on File Prior to Visit  Medication Sig Dispense Refill  . fish oil-omega-3 fatty acids 1000 MG capsule Take 2 g by mouth daily.      Marland Kitchen levothyroxine (SYNTHROID, LEVOTHROID) 50 MCG tablet Take 50 mcg by mouth daily.      Marland Kitchen METOPROLOL SUCCINATE ER PO Take 25 mg by mouth daily.    . Multiple Vitamin (MULTIVITAMIN) tablet Take 1 tablet by mouth daily.     . naproxen (NAPROSYN) 500 MG tablet Take 500 mg by mouth as needed.      . simvastatin (ZOCOR)  20 MG tablet Take 20 mg by mouth daily.     No current facility-administered medications on file prior to visit.     Allergies  Allergen Reactions  . Demerol Other (See Comments)  hallucinations  . Morphine And Related Other (See Comments)    hallucinations    Physical exam:  Today's Vitals   02/20/19 0855  BP: (!) 164/60  Pulse: (!) 57  Temp: 97.8 F (36.6 C)  Weight: 163 lb (73.9 kg)  Height: 5\' 10"  (1.778 m)   Body mass index is 23.39 kg/m.   Wt Readings from Last 3 Encounters:  02/20/19 163 lb (73.9 kg)  06/03/11 175 lb 1.6 oz (79.4 kg)  06/03/10 183 lb 3.2 oz (83.1 kg)     Ht Readings from Last 3 Encounters:  02/20/19 5\' 10"  (1.778 m)  06/03/11 5' 10.5" (1.791 m)      General: The patient is awake, alert and appears not in acute distress. The patient is well groomed. Head: Normocephalic, atraumatic. Neck is supple. Mallampati 3,  neck circumference:15 inches . Nasal airflow  patent.  Retrognathia is seen. Small lower jaw. .  Dental status:  Partial  Cardiovascular:  Regular rate and cardiac rhythm by pulse,  without distended neck veins. Respiratory: Lungs are clear to auscultation.  Skin:  Without evidence of ankle edema, or rash. Trunk: The patient's posture is erect.   Neurologic exam : The patient is awake and alert, oriented to place and time.   Memory subjective described as intact.  Attention span & concentration ability appears normal.  Speech is fluent,  without  dysarthria, dysphonia or aphasia.  Mood and affect are appropriate.   Cranial nerves: no loss of smell or taste reported  Pupils are equal and briskly reactive to light. Funduscopic exam deferred..  Extraocular movements in vertical and horizontal planes were intact and without nystagmus. No Diplopia. Visual fields by finger perimetry are intact. Hearing was intact to soft voice and finger rubbing.    Facial sensation intact to fine touch.  Facial motor strength is symmetric and  tongue and uvula move midline.  Neck ROM : rotation, tilt and flexion extension were normal for age and shoulder shrug was symmetrical.    Motor exam:  The patient has large nodular joint changes, at elbow and fingers , right wrist but reports these are painless. Symmetric muscle bulk, slight rigidity in tone and restricted hand and wrirt ROM.   Normal tone without cog wheeling, symmetric grip strength .  Sensory:  Fine touch, pinprick and vibration were tested  and normal.  Proprioception tested in the upper extremities was normal.  Coordination: Rapid alternating movements in the fingers/hands were of normal speed.  The Finger-to-nose maneuver was intact without evidence of ataxia, dysmetria or tremor. Gait and station: Patient could rise unassisted from a seated position, walked without assistive device.  Stance is of normal width/ base and the patient turned with 4 steps.  Toe and heel walk were deferred.  Deep tendon reflexes: in the  upper and lower extremities are symmetric and intact.  Babinski response was deferred normal        After spending a total time of 35  minutes face to face and additional time for physical and neurologic examination, review of laboratory studies,  personal review of imaging studies, reports and results of other testing and review of referral information / records as far as provided in visit, I have established the following assessments:  1)  The reported retinal vascular congestion can be seen in OSA patients and can be reversed with apnea therapy  2) The loud snoring and reported vivid dreams, elevated muscle tone all can be seen with neurodegenerative disorders as well. I  will order an attended sleep study to have EEG data and video available. .    My Plan is to proceed with:  1) expanded EEG montage for patient with vivid dreams and loud snoring, retinal vascular congestion.     I would like to thank Dr Evan Lair, MD and  [name], [address] [place],  [place] [address] for allowing me to meet with and to take care of this pleasant patient.  I plan to follow up either personally or through our NP within 2-3  month.   CC: I will share my notes with Dr [name]  Electronically signed by: Evan Seat, MD 02/20/2019 9:04 AM  [facility] Board certified by The [employer] of Sleep Medicine and Diplomate of the [employer] of Sleep Medicine. Board certified In Neurology through the [place], Fellow of the [employer] of Neurology. Medical Director of [facility].

## 2019-02-28 ENCOUNTER — Ambulatory Visit (INDEPENDENT_AMBULATORY_CARE_PROVIDER_SITE_OTHER): Payer: Medicare Other | Admitting: Neurology

## 2019-02-28 ENCOUNTER — Other Ambulatory Visit: Payer: Self-pay

## 2019-02-28 DIAGNOSIS — C188 Malignant neoplasm of overlapping sites of colon: Secondary | ICD-10-CM

## 2019-02-28 DIAGNOSIS — G4761 Periodic limb movement disorder: Secondary | ICD-10-CM

## 2019-02-28 DIAGNOSIS — Z933 Colostomy status: Secondary | ICD-10-CM

## 2019-02-28 DIAGNOSIS — Z7289 Other problems related to lifestyle: Secondary | ICD-10-CM

## 2019-02-28 DIAGNOSIS — E038 Other specified hypothyroidism: Secondary | ICD-10-CM

## 2019-02-28 DIAGNOSIS — M1A0491 Idiopathic chronic gout, unspecified hand, with tophus (tophi): Secondary | ICD-10-CM

## 2019-02-28 DIAGNOSIS — F17290 Nicotine dependence, other tobacco product, uncomplicated: Secondary | ICD-10-CM

## 2019-02-28 DIAGNOSIS — Z789 Other specified health status: Secondary | ICD-10-CM

## 2019-02-28 DIAGNOSIS — R0683 Snoring: Secondary | ICD-10-CM

## 2019-02-28 DIAGNOSIS — H3581 Retinal edema: Secondary | ICD-10-CM

## 2019-02-28 DIAGNOSIS — F1090 Alcohol use, unspecified, uncomplicated: Secondary | ICD-10-CM

## 2019-03-15 DIAGNOSIS — H3581 Retinal edema: Secondary | ICD-10-CM | POA: Insufficient documentation

## 2019-03-15 NOTE — Procedures (Signed)
PATIENT'S NAME:  [name], [name] DOB:      1934/09/11      MR#:    [id number]     DATE OF RECORDING: 02/28/2019 [name] REFERRING M.D.:  Deloria Lair, MD- and Dr. Maudie Mercury, PCP  Study Performed:   Baseline Polysomnogram HISTORY:  [name] is 84 years old, a Caucasian gentleman with anemia, hyperlipidemia, Gout, kidney stones, HTN and history of alcohol use, colon cancer, and neuropathy and thyroid disease. He endorsed 7/15 points on the geriatric depression score.   [name] presented first to an optometrist thinking that he needed new glasses.  After the exam with the optician he was referred for cataract surgery.  After surgery it was evident that he had fluid leakage and retinal vascular congestion.  Dr. Zadie Rhine asked about symptoms and signs of sleep apnea to be present of which there are some present and the patient was therefore referred for a sleep apnea evaluation.   The patient endorsed the Epworth Sleepiness Scale at 5 points.   The patient's weight 163 pounds with a height of 70 (inches), resulting in a BMI of 23.4 kg/m2. The patient's neck circumference measured 15 inches.  CURRENT MEDICATIONS: Fish Oil, Levothyroxine, Metoprolol, Multivitamin, Naprosyn, Zocor   PROCEDURE:  This is a multichannel digital polysomnogram utilizing the Somnostar 11.2 system.  Electrodes and sensors were applied and monitored per AASM Specifications.   EEG, EOG, Chin and Limb EMG, were sampled at 200 Hz.  ECG, Snore and Nasal Pressure, Thermal Airflow, Respiratory Effort, CPAP Flow and Pressure, Oximetry was sampled at 50 Hz. Digital video and audio were recorded.      BASELINE STUDY: Lights Out was at 21:54 and Lights On at 05:00.  Total recording time (TRT) was 427 minutes, with a total sleep time (TST) of 311.5 minutes.  The patient's sleep latency was 120 minutes.  REM latency was 215 minutes.  The sleep efficiency was 73. %.     SLEEP ARCHITECTURE: WASO (Wake after sleep onset) was 65.5 minutes.  There were 58  minutes in Stage N1, 203.5 minutes Stage N2, 12.5 minutes Stage N3 and 37.5 minutes in Stage REM.  The percentage of Stage N1 was 18.6%, Stage N2 was 65.3%, Stage N3 was 4.0% and Stage R (REM sleep) was 12.%.   RESPIRATORY ANALYSIS:  There were a total of 5 respiratory events:  0 apneas and 5 hypopneas.     The total APNEA/HYPOPNEA INDEX (AHI) was 1.0 /hour.  2 events occurred in REM sleep and 6 events in NREM. The REM AHI was 3.2 /hour, versus a non-REM AHI of 0.7. The patient spent 40.5 minutes of total sleep time in the supine position and 271 minutes in non-supine. The supine AHI was 3.0 versus a non-supine AHI of 0.7.  OXYGEN SATURATION & C02:  The Wake baseline 02 saturation was 96%, with the lowest being 89%.  The arousals were noted as: 16 were spontaneous, 141 were associated with PLMs, and 2 were associated with respiratory events. The patient had a total of 688 Periodic Limb Movements.  The Periodic Limb Movement (PLM) index was 132.5 and the PLM Arousal index was 27.2/hour. Audio and video analysis did show PLMs, but not any abnormal or unusual movements, behaviors, phonations or vocalizations.  His movements stopped during REM sleep.  Mild Snoring was noted. EKG was in keeping with normal sinus rhythm (NSR).    IMPRESSION: NO indication of a sleep disorder contributing to retinal vascular changes. No sleep Apnea, oxygen desaturation, or  cardiac arrhythmia.  1. Periodic Limb Movement Disorder (PLMD), causing many arousals, but not extending into REM sleep- Therefore, no REM B disorder is present.    RECOMMENDATIONS: PLMs can be a result of orthopedic problems, spinal stenosis, hip pain, and can follow DVT, anemia, neoplasm and it's therapies ( neuropathy from chemotherapy) or neuropathy from DM, Thyroid disease. Also possible causes are: PAD, ETOH induced, and by Iron and Vit B deficiencies.   Main result: No indication of sleep disordered breathing. Please ask patient to take a  multivitamin with B complex.    I certify that I have reviewed the entire raw data recording prior to the issuance of this report in accordance with the Standards of Accreditation of the American Academy of Sleep Medicine (AASM)   Larey Seat, MD   03-13-2019 Diplomat, American Board of Psychiatry and Neurology  Diplomat, American Board of [place] Director, [employer] [employer]

## 2019-03-18 ENCOUNTER — Telehealth: Payer: Self-pay | Admitting: Neurology

## 2019-03-18 DIAGNOSIS — H59031 Cystoid macular edema following cataract surgery, right eye: Secondary | ICD-10-CM | POA: Diagnosis not present

## 2019-03-18 DIAGNOSIS — H35072 Retinal telangiectasis, left eye: Secondary | ICD-10-CM | POA: Diagnosis not present

## 2019-03-18 DIAGNOSIS — H35071 Retinal telangiectasis, right eye: Secondary | ICD-10-CM | POA: Diagnosis not present

## 2019-03-18 DIAGNOSIS — H35351 Cystoid macular degeneration, right eye: Secondary | ICD-10-CM | POA: Diagnosis not present

## 2019-03-18 NOTE — Telephone Encounter (Signed)
-----   Message from Larey Seat, MD sent at 03/15/2019 11:53 AM EDT -----  IMPRESSION: NO indication of a sleep disorder contributing to retinal vascular changes. No sleep Apnea, oxygen desaturation, or cardiac arrhythmia.  1. Periodic Limb Movement Disorder (PLMD), causing many arousals, but not extending into REM sleep- Therefore, no REM Behavior disorder is present.  Main result: No indication of sleep disordered breathing. Please ask patient to take a multivitamin with B complex.  2. If the patient feels in need of PLM treatment ( if RLS are present, for example) , will [facility] follow up in a visit with one of our NP- PLMs can be a result of Orthopedic problems, Spinal stenosis, Hip pain, and can also follow DVT, anemia, neoplasm and it's therapies ( Neuropathy from Chemotherapy) or Neuropathy from DM, Thyroid disease. Also possible causes are: PAD/PVD, Malabsorption or ETOH induced, and by Iron and Vit B deficiencies.

## 2019-03-18 NOTE — Telephone Encounter (Signed)
Daughter called and I was able to review the sleep study with her over the phone. Was able to inform there was no sleep disorder found on the test. Advised the patient's daughter there was restless in extremities noted and Dr Brett Fairy advises that the pt take a multivitamin with B Complex. Daughter verbalized understanding and was appreciated for the information. I have routed the result to Dr Zadie Rhine as the patient as a apt today. Also routed to PCP per daughter request.

## 2019-03-25 DIAGNOSIS — E039 Hypothyroidism, unspecified: Secondary | ICD-10-CM | POA: Diagnosis not present

## 2019-03-25 DIAGNOSIS — I10 Essential (primary) hypertension: Secondary | ICD-10-CM | POA: Diagnosis not present

## 2019-04-01 DIAGNOSIS — I10 Essential (primary) hypertension: Secondary | ICD-10-CM | POA: Diagnosis not present

## 2019-04-01 DIAGNOSIS — E7800 Pure hypercholesterolemia, unspecified: Secondary | ICD-10-CM | POA: Diagnosis not present

## 2019-04-01 DIAGNOSIS — D649 Anemia, unspecified: Secondary | ICD-10-CM | POA: Diagnosis not present

## 2019-04-01 DIAGNOSIS — E039 Hypothyroidism, unspecified: Secondary | ICD-10-CM | POA: Diagnosis not present

## 2019-04-29 DIAGNOSIS — H35351 Cystoid macular degeneration, right eye: Secondary | ICD-10-CM | POA: Diagnosis not present

## 2019-04-29 DIAGNOSIS — Z961 Presence of intraocular lens: Secondary | ICD-10-CM | POA: Diagnosis not present

## 2019-04-29 DIAGNOSIS — H1045 Other chronic allergic conjunctivitis: Secondary | ICD-10-CM | POA: Diagnosis not present

## 2019-07-01 DIAGNOSIS — E039 Hypothyroidism, unspecified: Secondary | ICD-10-CM | POA: Diagnosis not present

## 2019-07-01 DIAGNOSIS — I10 Essential (primary) hypertension: Secondary | ICD-10-CM | POA: Diagnosis not present

## 2019-07-03 DIAGNOSIS — H1045 Other chronic allergic conjunctivitis: Secondary | ICD-10-CM | POA: Diagnosis not present

## 2019-07-03 DIAGNOSIS — Z961 Presence of intraocular lens: Secondary | ICD-10-CM | POA: Diagnosis not present

## 2019-07-03 DIAGNOSIS — H3589 Other specified retinal disorders: Secondary | ICD-10-CM | POA: Diagnosis not present

## 2019-07-08 DIAGNOSIS — I10 Essential (primary) hypertension: Secondary | ICD-10-CM | POA: Diagnosis not present

## 2019-07-08 DIAGNOSIS — E785 Hyperlipidemia, unspecified: Secondary | ICD-10-CM | POA: Diagnosis not present

## 2019-07-08 DIAGNOSIS — D649 Anemia, unspecified: Secondary | ICD-10-CM | POA: Diagnosis not present

## 2019-07-08 DIAGNOSIS — E039 Hypothyroidism, unspecified: Secondary | ICD-10-CM | POA: Diagnosis not present

## 2019-08-14 ENCOUNTER — Ambulatory Visit: Payer: Medicare Other | Attending: Internal Medicine

## 2019-08-14 DIAGNOSIS — Z23 Encounter for immunization: Secondary | ICD-10-CM | POA: Insufficient documentation

## 2019-08-14 NOTE — Progress Notes (Signed)
   Covid-19 Vaccination Clinic  Name:  [name]    MRN: [id number] DOB: October 07, 1934  08/14/2019  Mr. Scheid was observed post Covid-19 immunization for 15 minutes without incidence. He was provided with Vaccine Information Sheet and instruction to access the V-Safe system.   Mr. Dhawan was instructed to call 911 with any severe reactions post vaccine: Marland Kitchen Difficulty breathing  . Swelling of your face and throat  . A fast heartbeat  . A bad rash all over your body  . Dizziness and weakness    Immunizations Administered    Name Date Dose VIS Date Route   Moderna COVID-19 Vaccine 08/14/2019  1:21 PM 0.5 mL 05/28/2019 Intramuscular   Manufacturer: Moderna   Lot: [id number]   [id number]

## 2019-08-26 DIAGNOSIS — D649 Anemia, unspecified: Secondary | ICD-10-CM | POA: Diagnosis not present

## 2019-08-26 DIAGNOSIS — R718 Other abnormality of red blood cells: Secondary | ICD-10-CM | POA: Diagnosis not present

## 2019-08-26 DIAGNOSIS — E039 Hypothyroidism, unspecified: Secondary | ICD-10-CM | POA: Diagnosis not present

## 2019-08-26 DIAGNOSIS — Z79899 Other long term (current) drug therapy: Secondary | ICD-10-CM | POA: Diagnosis not present

## 2019-08-26 DIAGNOSIS — E7800 Pure hypercholesterolemia, unspecified: Secondary | ICD-10-CM | POA: Diagnosis not present

## 2019-08-26 DIAGNOSIS — E785 Hyperlipidemia, unspecified: Secondary | ICD-10-CM | POA: Diagnosis not present

## 2019-08-26 DIAGNOSIS — Z0000 Encounter for general adult medical examination without abnormal findings: Secondary | ICD-10-CM | POA: Diagnosis not present

## 2019-08-26 DIAGNOSIS — I10 Essential (primary) hypertension: Secondary | ICD-10-CM | POA: Diagnosis not present

## 2019-09-06 DIAGNOSIS — E781 Pure hyperglyceridemia: Secondary | ICD-10-CM | POA: Diagnosis not present

## 2019-09-06 DIAGNOSIS — E039 Hypothyroidism, unspecified: Secondary | ICD-10-CM | POA: Diagnosis not present

## 2019-09-06 DIAGNOSIS — Z0000 Encounter for general adult medical examination without abnormal findings: Secondary | ICD-10-CM | POA: Diagnosis not present

## 2019-09-06 DIAGNOSIS — I10 Essential (primary) hypertension: Secondary | ICD-10-CM | POA: Diagnosis not present

## 2019-09-11 ENCOUNTER — Ambulatory Visit: Payer: Medicare Other | Attending: Internal Medicine

## 2019-09-11 DIAGNOSIS — Z23 Encounter for immunization: Secondary | ICD-10-CM

## 2019-09-11 NOTE — Progress Notes (Signed)
   Covid-19 Vaccination Clinic  Name:  [name]    MRN: [id number] DOB: [birth year]  09/11/2019  [name] was observed post Covid-19 immunization for 15 minutes without incident. He was provided with Vaccine Information Sheet and instruction to access the V-Safe system.   [name] was instructed to call 911 with any severe reactions post vaccine: Marland Kitchen Difficulty breathing  . Swelling of face and throat  . A fast heartbeat  . A bad rash all over body  . Dizziness and weakness   Immunizations Administered    Name Date Dose VIS Date Route   Moderna COVID-19 Vaccine 09/11/2019  1:08 PM 0.5 mL 05/28/2019 Intramuscular   Manufacturer: Moderna   Lot: [id number]   [id number]

## 2019-12-31 DIAGNOSIS — I10 Essential (primary) hypertension: Secondary | ICD-10-CM | POA: Diagnosis not present

## 2019-12-31 DIAGNOSIS — E039 Hypothyroidism, unspecified: Secondary | ICD-10-CM | POA: Diagnosis not present

## 2020-01-06 DIAGNOSIS — E7800 Pure hypercholesterolemia, unspecified: Secondary | ICD-10-CM | POA: Diagnosis not present

## 2020-01-06 DIAGNOSIS — E039 Hypothyroidism, unspecified: Secondary | ICD-10-CM | POA: Diagnosis not present

## 2020-01-06 DIAGNOSIS — D649 Anemia, unspecified: Secondary | ICD-10-CM | POA: Diagnosis not present

## 2020-01-06 DIAGNOSIS — I10 Essential (primary) hypertension: Secondary | ICD-10-CM | POA: Diagnosis not present

## 2020-04-30 DIAGNOSIS — E039 Hypothyroidism, unspecified: Secondary | ICD-10-CM | POA: Diagnosis not present

## 2020-04-30 DIAGNOSIS — I10 Essential (primary) hypertension: Secondary | ICD-10-CM | POA: Diagnosis not present

## 2020-04-30 DIAGNOSIS — E7800 Pure hypercholesterolemia, unspecified: Secondary | ICD-10-CM | POA: Diagnosis not present

## 2020-04-30 DIAGNOSIS — Z0000 Encounter for general adult medical examination without abnormal findings: Secondary | ICD-10-CM | POA: Diagnosis not present

## 2020-05-12 DIAGNOSIS — I10 Essential (primary) hypertension: Secondary | ICD-10-CM | POA: Diagnosis not present

## 2020-05-12 DIAGNOSIS — R0681 Apnea, not elsewhere classified: Secondary | ICD-10-CM | POA: Diagnosis not present

## 2020-05-12 DIAGNOSIS — E7800 Pure hypercholesterolemia, unspecified: Secondary | ICD-10-CM | POA: Diagnosis not present

## 2020-05-12 DIAGNOSIS — R079 Chest pain, unspecified: Secondary | ICD-10-CM | POA: Diagnosis not present

## 2020-05-12 DIAGNOSIS — E039 Hypothyroidism, unspecified: Secondary | ICD-10-CM | POA: Diagnosis not present

## 2020-07-02 DIAGNOSIS — L84 Corns and callosities: Secondary | ICD-10-CM | POA: Diagnosis not present

## 2020-07-02 DIAGNOSIS — B351 Tinea unguium: Secondary | ICD-10-CM | POA: Diagnosis not present

## 2020-07-02 DIAGNOSIS — M79676 Pain in unspecified toe(s): Secondary | ICD-10-CM | POA: Diagnosis not present

## 2020-11-03 DIAGNOSIS — E785 Hyperlipidemia, unspecified: Secondary | ICD-10-CM | POA: Diagnosis not present

## 2020-11-03 DIAGNOSIS — M109 Gout, unspecified: Secondary | ICD-10-CM | POA: Diagnosis not present

## 2020-11-03 DIAGNOSIS — E039 Hypothyroidism, unspecified: Secondary | ICD-10-CM | POA: Diagnosis not present

## 2020-11-03 DIAGNOSIS — Z79899 Other long term (current) drug therapy: Secondary | ICD-10-CM | POA: Diagnosis not present

## 2020-11-10 DIAGNOSIS — M1A9XX1 Chronic gout, unspecified, with tophus (tophi): Secondary | ICD-10-CM | POA: Diagnosis not present

## 2020-11-10 DIAGNOSIS — E7800 Pure hypercholesterolemia, unspecified: Secondary | ICD-10-CM | POA: Diagnosis not present

## 2020-11-10 DIAGNOSIS — D509 Iron deficiency anemia, unspecified: Secondary | ICD-10-CM | POA: Diagnosis not present

## 2020-11-10 DIAGNOSIS — Z23 Encounter for immunization: Secondary | ICD-10-CM | POA: Diagnosis not present

## 2020-11-10 DIAGNOSIS — I10 Essential (primary) hypertension: Secondary | ICD-10-CM | POA: Diagnosis not present

## 2020-11-10 DIAGNOSIS — E039 Hypothyroidism, unspecified: Secondary | ICD-10-CM | POA: Diagnosis not present

## 2020-11-10 DIAGNOSIS — Z0000 Encounter for general adult medical examination without abnormal findings: Secondary | ICD-10-CM | POA: Diagnosis not present

## 2020-11-10 DIAGNOSIS — M109 Gout, unspecified: Secondary | ICD-10-CM | POA: Diagnosis not present

## 2020-12-01 DIAGNOSIS — H5703 Miosis: Secondary | ICD-10-CM | POA: Diagnosis not present

## 2020-12-01 DIAGNOSIS — H1045 Other chronic allergic conjunctivitis: Secondary | ICD-10-CM | POA: Diagnosis not present

## 2020-12-01 DIAGNOSIS — H47323 Drusen of optic disc, bilateral: Secondary | ICD-10-CM | POA: Diagnosis not present

## 2020-12-01 DIAGNOSIS — Z961 Presence of intraocular lens: Secondary | ICD-10-CM | POA: Diagnosis not present

## 2021-05-10 DIAGNOSIS — Z0000 Encounter for general adult medical examination without abnormal findings: Secondary | ICD-10-CM | POA: Diagnosis not present

## 2021-05-10 DIAGNOSIS — E785 Hyperlipidemia, unspecified: Secondary | ICD-10-CM | POA: Diagnosis not present

## 2021-05-10 DIAGNOSIS — E039 Hypothyroidism, unspecified: Secondary | ICD-10-CM | POA: Diagnosis not present

## 2021-05-10 DIAGNOSIS — I10 Essential (primary) hypertension: Secondary | ICD-10-CM | POA: Diagnosis not present

## 2021-05-17 DIAGNOSIS — E039 Hypothyroidism, unspecified: Secondary | ICD-10-CM | POA: Diagnosis not present

## 2021-05-17 DIAGNOSIS — D509 Iron deficiency anemia, unspecified: Secondary | ICD-10-CM | POA: Diagnosis not present

## 2021-05-17 DIAGNOSIS — M109 Gout, unspecified: Secondary | ICD-10-CM | POA: Diagnosis not present

## 2021-05-17 DIAGNOSIS — M1A9XX1 Chronic gout, unspecified, with tophus (tophi): Secondary | ICD-10-CM | POA: Diagnosis not present

## 2021-05-17 DIAGNOSIS — E7800 Pure hypercholesterolemia, unspecified: Secondary | ICD-10-CM | POA: Diagnosis not present

## 2021-05-17 DIAGNOSIS — E785 Hyperlipidemia, unspecified: Secondary | ICD-10-CM | POA: Diagnosis not present

## 2021-05-17 DIAGNOSIS — Z0000 Encounter for general adult medical examination without abnormal findings: Secondary | ICD-10-CM | POA: Diagnosis not present

## 2021-05-17 DIAGNOSIS — Z23 Encounter for immunization: Secondary | ICD-10-CM | POA: Diagnosis not present

## 2021-05-17 DIAGNOSIS — I700 Atherosclerosis of aorta: Secondary | ICD-10-CM | POA: Diagnosis not present

## 2021-05-17 DIAGNOSIS — R634 Abnormal weight loss: Secondary | ICD-10-CM | POA: Diagnosis not present

## 2021-05-17 DIAGNOSIS — I10 Essential (primary) hypertension: Secondary | ICD-10-CM | POA: Diagnosis not present

## 2022-05-09 DIAGNOSIS — E039 Hypothyroidism, unspecified: Secondary | ICD-10-CM | POA: Diagnosis not present

## 2022-05-09 DIAGNOSIS — D509 Iron deficiency anemia, unspecified: Secondary | ICD-10-CM | POA: Diagnosis not present

## 2022-05-09 DIAGNOSIS — M109 Gout, unspecified: Secondary | ICD-10-CM | POA: Diagnosis not present

## 2022-05-16 DIAGNOSIS — E785 Hyperlipidemia, unspecified: Secondary | ICD-10-CM | POA: Diagnosis not present

## 2022-05-16 DIAGNOSIS — D509 Iron deficiency anemia, unspecified: Secondary | ICD-10-CM | POA: Diagnosis not present

## 2022-05-16 DIAGNOSIS — F17200 Nicotine dependence, unspecified, uncomplicated: Secondary | ICD-10-CM | POA: Diagnosis not present

## 2022-05-16 DIAGNOSIS — M109 Gout, unspecified: Secondary | ICD-10-CM | POA: Diagnosis not present

## 2022-05-16 DIAGNOSIS — E039 Hypothyroidism, unspecified: Secondary | ICD-10-CM | POA: Diagnosis not present

## 2022-05-16 DIAGNOSIS — I700 Atherosclerosis of aorta: Secondary | ICD-10-CM | POA: Diagnosis not present

## 2022-11-08 DIAGNOSIS — E039 Hypothyroidism, unspecified: Secondary | ICD-10-CM | POA: Diagnosis not present

## 2022-11-08 DIAGNOSIS — D509 Iron deficiency anemia, unspecified: Secondary | ICD-10-CM | POA: Diagnosis not present

## 2022-11-08 DIAGNOSIS — M109 Gout, unspecified: Secondary | ICD-10-CM | POA: Diagnosis not present

## 2022-11-08 DIAGNOSIS — E785 Hyperlipidemia, unspecified: Secondary | ICD-10-CM | POA: Diagnosis not present

## 2022-11-15 DIAGNOSIS — E785 Hyperlipidemia, unspecified: Secondary | ICD-10-CM | POA: Diagnosis not present

## 2022-11-15 DIAGNOSIS — M109 Gout, unspecified: Secondary | ICD-10-CM | POA: Diagnosis not present

## 2022-11-15 DIAGNOSIS — Z79899 Other long term (current) drug therapy: Secondary | ICD-10-CM | POA: Diagnosis not present

## 2022-11-15 DIAGNOSIS — E039 Hypothyroidism, unspecified: Secondary | ICD-10-CM | POA: Diagnosis not present

## 2022-11-15 DIAGNOSIS — F17200 Nicotine dependence, unspecified, uncomplicated: Secondary | ICD-10-CM | POA: Diagnosis not present

## 2022-11-15 DIAGNOSIS — D509 Iron deficiency anemia, unspecified: Secondary | ICD-10-CM | POA: Diagnosis not present

## 2022-11-15 DIAGNOSIS — I700 Atherosclerosis of aorta: Secondary | ICD-10-CM | POA: Diagnosis not present

## 2022-11-15 DIAGNOSIS — Z0000 Encounter for general adult medical examination without abnormal findings: Secondary | ICD-10-CM | POA: Diagnosis not present

## 2022-12-07 DIAGNOSIS — H47323 Drusen of optic disc, bilateral: Secondary | ICD-10-CM | POA: Diagnosis not present

## 2022-12-07 DIAGNOSIS — H1045 Other chronic allergic conjunctivitis: Secondary | ICD-10-CM | POA: Diagnosis not present

## 2022-12-07 DIAGNOSIS — H35372 Puckering of macula, left eye: Secondary | ICD-10-CM | POA: Diagnosis not present

## 2022-12-07 DIAGNOSIS — Z961 Presence of intraocular lens: Secondary | ICD-10-CM | POA: Diagnosis not present

## 2022-12-07 DIAGNOSIS — H43813 Vitreous degeneration, bilateral: Secondary | ICD-10-CM | POA: Diagnosis not present

## 2022-12-07 DIAGNOSIS — H26491 Other secondary cataract, right eye: Secondary | ICD-10-CM | POA: Diagnosis not present

## 2023-05-11 DIAGNOSIS — D509 Iron deficiency anemia, unspecified: Secondary | ICD-10-CM | POA: Diagnosis not present

## 2023-05-11 DIAGNOSIS — F17200 Nicotine dependence, unspecified, uncomplicated: Secondary | ICD-10-CM | POA: Diagnosis not present

## 2023-05-11 DIAGNOSIS — Z79899 Other long term (current) drug therapy: Secondary | ICD-10-CM | POA: Diagnosis not present

## 2023-05-11 DIAGNOSIS — E039 Hypothyroidism, unspecified: Secondary | ICD-10-CM | POA: Diagnosis not present

## 2023-05-18 DIAGNOSIS — E785 Hyperlipidemia, unspecified: Secondary | ICD-10-CM | POA: Diagnosis not present

## 2023-05-18 DIAGNOSIS — D509 Iron deficiency anemia, unspecified: Secondary | ICD-10-CM | POA: Diagnosis not present

## 2023-05-18 DIAGNOSIS — F17200 Nicotine dependence, unspecified, uncomplicated: Secondary | ICD-10-CM | POA: Diagnosis not present

## 2023-05-18 DIAGNOSIS — M109 Gout, unspecified: Secondary | ICD-10-CM | POA: Diagnosis not present

## 2023-05-18 DIAGNOSIS — I700 Atherosclerosis of aorta: Secondary | ICD-10-CM | POA: Diagnosis not present

## 2023-05-18 DIAGNOSIS — E039 Hypothyroidism, unspecified: Secondary | ICD-10-CM | POA: Diagnosis not present

## 2023-11-08 DIAGNOSIS — D509 Iron deficiency anemia, unspecified: Secondary | ICD-10-CM | POA: Diagnosis not present

## 2023-11-08 DIAGNOSIS — E785 Hyperlipidemia, unspecified: Secondary | ICD-10-CM | POA: Diagnosis not present

## 2023-11-08 DIAGNOSIS — E039 Hypothyroidism, unspecified: Secondary | ICD-10-CM | POA: Diagnosis not present

## 2023-11-08 DIAGNOSIS — M109 Gout, unspecified: Secondary | ICD-10-CM | POA: Diagnosis not present

## 2023-11-08 DIAGNOSIS — I700 Atherosclerosis of aorta: Secondary | ICD-10-CM | POA: Diagnosis not present

## 2023-11-15 DIAGNOSIS — F17200 Nicotine dependence, unspecified, uncomplicated: Secondary | ICD-10-CM | POA: Diagnosis not present

## 2023-11-15 DIAGNOSIS — Z0000 Encounter for general adult medical examination without abnormal findings: Secondary | ICD-10-CM | POA: Diagnosis not present

## 2023-11-15 DIAGNOSIS — E039 Hypothyroidism, unspecified: Secondary | ICD-10-CM | POA: Diagnosis not present

## 2023-11-15 DIAGNOSIS — I700 Atherosclerosis of aorta: Secondary | ICD-10-CM | POA: Diagnosis not present
